# Patient Record
Sex: Female | Born: 1995 | Race: White | Hispanic: No | Marital: Single | Smoking: Current every day smoker
Health system: Southern US, Community
[De-identification: ages and names within clinical notes are randomized; demographics above are authoritative.]

## PROBLEM LIST (undated history)

## (undated) DIAGNOSIS — B1920 Unspecified viral hepatitis C without hepatic coma: Secondary | ICD-10-CM

## (undated) DIAGNOSIS — F1920 Other psychoactive substance dependence, uncomplicated: Secondary | ICD-10-CM

## (undated) DIAGNOSIS — J45909 Unspecified asthma, uncomplicated: Secondary | ICD-10-CM

## (undated) DIAGNOSIS — F1910 Other psychoactive substance abuse, uncomplicated: Secondary | ICD-10-CM

---
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
## 2016-02-02 ENCOUNTER — Encounter: Payer: Self-pay | Admitting: Emergency Medicine

## 2016-02-02 ENCOUNTER — Emergency Department
Admission: EM | Admit: 2016-02-02 | Discharge: 2016-02-02 | Disposition: A | Discharge status: Home / Self Care | Payer: Self-pay | Attending: Emergency Medicine | Admitting: Emergency Medicine

## 2016-02-02 DIAGNOSIS — M79641 Pain in right hand: Secondary | ICD-10-CM | POA: Insufficient documentation

## 2016-02-02 DIAGNOSIS — Z5321 Procedure and treatment not carried out due to patient leaving prior to being seen by health care provider: Secondary | ICD-10-CM | POA: Insufficient documentation

## 2016-02-02 HISTORY — DX: Unspecified viral hepatitis C without hepatic coma: B19.20

## 2016-02-02 NOTE — ED Notes (Signed)
Patient has not returned - eloped from the status board at this time.

## 2016-02-02 NOTE — ED Notes (Signed)
Patient presented to the desk at this time to report that she was leaving to pick up here husband, but would "be right back".

## 2016-02-02 NOTE — ED Notes (Signed)
Pt with R ring finger swelling and pain starting 7 days ago.  Finger appears swollen, purple.  Pt states finger feels like "pins and needles" and throbbing.  Pt denies fever, but reports being able to squeeze puss out of it a few days ago.  Pt unable to move finger.  Denies knowledge of any injury.

## 2017-06-24 ENCOUNTER — Emergency Department (HOSPITAL_COMMUNITY)
Admission: EM | Admit: 2017-06-24 | Discharge: 2017-06-24 | Disposition: A | Discharge status: Home / Self Care | Payer: Self-pay | Attending: Emergency Medicine | Admitting: Emergency Medicine

## 2017-06-24 ENCOUNTER — Encounter (HOSPITAL_COMMUNITY): Payer: Self-pay | Admitting: Emergency Medicine

## 2017-06-24 DIAGNOSIS — F1123 Opioid dependence with withdrawal: Secondary | ICD-10-CM

## 2017-06-24 DIAGNOSIS — F17200 Nicotine dependence, unspecified, uncomplicated: Secondary | ICD-10-CM | POA: Insufficient documentation

## 2017-06-24 HISTORY — DX: Other psychoactive substance dependence, uncomplicated: F19.20

## 2017-06-24 LAB — RAPID URINE DRUG SCREEN, HOSP PERFORMED
Amphetamines: POSITIVE — AB
BARBITURATES: NOT DETECTED
BENZODIAZEPINES: NOT DETECTED
Cocaine: NOT DETECTED
Opiates: POSITIVE — AB
Tetrahydrocannabinol: NOT DETECTED

## 2017-06-24 LAB — COMPREHENSIVE METABOLIC PANEL WITH GFR
ALT: 46 U/L (ref 14–54)
AST: 32 U/L (ref 15–41)
Albumin: 3.8 g/dL (ref 3.5–5.0)
Alkaline Phosphatase: 57 U/L (ref 38–126)
Anion gap: 7 (ref 5–15)
BUN: 19 mg/dL (ref 6–20)
CO2: 28 mmol/L (ref 22–32)
Calcium: 8.9 mg/dL (ref 8.9–10.3)
Chloride: 104 mmol/L (ref 101–111)
Creatinine, Ser: 0.69 mg/dL (ref 0.44–1.00)
GFR calc Af Amer: >60 mL/min
GFR calc non Af Amer: >60 mL/min
Glucose, Bld: 111 mg/dL — ABNORMAL HIGH (ref 65–99)
Potassium: 4.1 mmol/L (ref 3.5–5.1)
Sodium: 139 mmol/L (ref 135–145)
Total Bilirubin: 0.8 mg/dL (ref 0.3–1.2)
Total Protein: 7.6 g/dL (ref 6.5–8.1)

## 2017-06-24 LAB — CBC
HCT: 40.3 % (ref 36.0–46.0)
Hemoglobin: 13.5 g/dL (ref 12.0–15.0)
MCH: 29.8 pg (ref 26.0–34.0)
MCHC: 33.5 g/dL (ref 30.0–36.0)
MCV: 89 fL (ref 78.0–100.0)
Platelets: 252 10*3/uL (ref 150–400)
RBC: 4.53 MIL/uL (ref 3.87–5.11)
RDW: 13.6 % (ref 11.5–15.5)
WBC: 7.4 10*3/uL (ref 4.0–10.5)

## 2017-06-24 LAB — ETHANOL: Alcohol, Ethyl (B): <10 mg/dL (ref <10)

## 2017-06-24 MED ORDER — ONDANSETRON HCL 4 MG PO TABS: 4.0000 mg | ORAL_TABLET | Freq: Four times a day (QID) | ORAL | 0 refills | Status: DC | PRN

## 2017-06-24 MED ORDER — LOPERAMIDE HCL 2 MG PO CAPS: 2.0000 mg | ORAL_CAPSULE | Freq: Four times a day (QID) | ORAL | 0 refills | Status: DC | PRN

## 2017-06-24 MED ORDER — METHOCARBAMOL 750 MG PO TABS: 750.0000 mg | ORAL_TABLET | Freq: Four times a day (QID) | ORAL | 0 refills | Status: DC | PRN

## 2017-06-24 NOTE — Discharge Instructions (Signed)
You are medically cleared for rehab/detox. Attached are a list of facilities  Please return for worsening symptoms, including intractable vomiting, confusion, thoughts of wanting to hurt yourself or other, or any other symptoms concerning to you.

## 2017-06-24 NOTE — ED Notes (Signed)
Pt was brought back to room 19, all wires was removed form room.

## 2017-06-24 NOTE — ED Triage Notes (Addendum)
Wants detox from heroin , last used last night .  Denies SI or HI  At this time

## 2017-06-24 NOTE — ED Provider Notes (Signed)
[facility] EMERGENCY DEPARTMENT Provider Note   CSN: [id number] Arrival date & time: 06/24/17  1216     History   Chief Complaint Chief Complaint  Patient presents with  . Medical Clearance    HPI Tracy Fowler is a 21 y.o. female.  HPI 21 year old female who presents for medical clearance for opiate dependency rehab. She has a history of opiate/heroin dependence. States that she has been abusing for over a year, and has been trying to quit by herself. Her mother has been assisting her, and is found her rehabilitation facility for her to go to but they requested that she come to the ED for medical clearance. Her last use of heroin was yesterday evening. Currently denies any nausea, vomiting, diarrhea, or any severe withdrawal symptoms. Denies any suicidal or homicidal thoughts. Denies etoh or other drug abuse.    Past Medical History:  Diagnosis Date  . Drug addict (HCC)   . Hepatitis C     There are no active problems to display for this patient.   History reviewed. No pertinent surgical history.  OB History    No data available       Home Medications    Prior to Admission medications   Medication Sig Start Date End Date Taking? Authorizing Provider  loperamide (IMODIUM) 2 MG capsule Take 1 capsule (2 mg total) by mouth 4 (four) times daily as needed for diarrhea or loose stools. 06/24/17   Lavera Guise, MD  methocarbamol (ROBAXIN-750) 750 MG tablet Take 1 tablet (750 mg total) by mouth every 6 (six) hours as needed for muscle spasms. 06/24/17   Lavera Guise, MD  ondansetron (ZOFRAN) 4 MG tablet Take 1 tablet (4 mg total) by mouth every 6 (six) hours as needed for nausea or vomiting. 06/24/17   Lavera Guise, MD    Family History No family history on file.  Social History Social History  Substance Use Topics  . Smoking status: Current Every Day Smoker  . Smokeless tobacco: Never Used  . Alcohol use No     Allergies    Prednisone   Review of Systems Review of Systems  Constitutional: Negative for fever.  Respiratory: Negative for shortness of breath.   Cardiovascular: Negative for chest pain.  Gastrointestinal: Negative for abdominal pain, nausea and vomiting.  Genitourinary: Negative for difficulty urinating.  All other systems reviewed and are negative.    Physical Exam Updated Vital Signs BP 105/72 (BP Location: Right Arm)   Pulse (!) 104   Temp 97.9 F (36.6 C)   Resp 16   SpO2 98%   Physical Exam Physical Exam  Nursing note and vitals reviewed. Constitutional: Well developed, well nourished, non-toxic, and in no acute distress Head: Normocephalic and atraumatic.  Mouth/Throat: Oropharynx is clear and moist.  Neck: Normal range of motion. Neck supple.  Cardiovascular: Normal rate and regular rhythm.   Pulmonary/Chest: Effort normal and breath sounds normal.  Abdominal: Soft. There is no tenderness. There is no rebound and no guarding.  Musculoskeletal: Normal range of motion.  Neurological: Alert, no facial droop, fluent speech, moves all extremities symmetrically Skin: Skin is warm and dry.  Psychiatric: Cooperative   ED Treatments / Results  Labs (all labs ordered are listed, but only abnormal results are displayed) Labs Reviewed  COMPREHENSIVE METABOLIC PANEL - Abnormal; Notable for the following:       Result Value   Glucose, Bld 111 (*)    All other components  within normal limits  RAPID URINE DRUG SCREEN, HOSP PERFORMED - Abnormal; Notable for the following:    Opiates POSITIVE (*)    Amphetamines POSITIVE (*)    All other components within normal limits  ETHANOL  CBC    EKG  EKG Interpretation None       Radiology No results found.  Procedures Procedures (including critical care time)  Medications Ordered in ED Medications - No data to display   Initial Impression / Assessment and Plan / ED Course  I have reviewed the triage vital signs and the  nursing notes.  Pertinent labs & imaging results that were available during my care of the patient were reviewed by me and considered in my medical decision making (see chart for details).     Patient well appearing, no acute distress. No evidence of psychosis or delusions. No SI/HI. Does not appear to have acute psychiatric complaint. Requesting assistance for detox. At this time, her withdrawal symptoms are reported to be very mild. Will prescribe symptomatic treatment for when symptoms become more severe. Resources provided for detox/rehab facilities, but she states there was one that she and her mother called already that they had in mind. Strict return and follow-up instructions reviewed. She expressed understanding of all discharge instructions and felt comfortable with the plan of care.    Final Clinical Impressions(s) / ED Diagnoses   Final diagnoses:  Opioid dependence with withdrawal (HCC)    New Prescriptions New Prescriptions   LOPERAMIDE (IMODIUM) 2 MG CAPSULE    Take 1 capsule (2 mg total) by mouth 4 (four) times daily as needed for diarrhea or loose stools.   METHOCARBAMOL (ROBAXIN-750) 750 MG TABLET    Take 1 tablet (750 mg total) by mouth every 6 (six) hours as needed for muscle spasms.   ONDANSETRON (ZOFRAN) 4 MG TABLET    Take 1 tablet (4 mg total) by mouth every 6 (six) hours as needed for nausea or vomiting.     Lavera GuiseLiu, Deloyd Handy Duo, MD 06/24/17 [id number]

## 2017-06-24 NOTE — ED Notes (Addendum)
Pt placed in hall holding area at pt request to maintain pt dignity. Mother to bring clothes for pt to change before leaving ED. Nurse first notified of relocation.

## 2018-09-03 DIAGNOSIS — I38 Endocarditis, valve unspecified: Secondary | ICD-10-CM

## 2018-09-03 DIAGNOSIS — R7881 Bacteremia: Secondary | ICD-10-CM

## 2018-09-03 DIAGNOSIS — I76 Septic arterial embolism: Secondary | ICD-10-CM

## 2018-09-03 HISTORY — DX: Endocarditis, valve unspecified: I38

## 2018-09-03 HISTORY — DX: Septic arterial embolism: I76

## 2018-09-03 HISTORY — DX: Bacteremia: R78.81

## 2019-02-17 ENCOUNTER — Encounter (HOSPITAL_COMMUNITY): Payer: Self-pay | Admitting: Infectious Diseases

## 2019-02-17 ENCOUNTER — Inpatient Hospital Stay (HOSPITAL_COMMUNITY)
Admission: AD | Admit: 2019-02-17 | Discharge: 2019-02-24 | DRG: 871 | Discharge status: Against Medical Advice | Payer: Self-pay | Source: Other Acute Inpatient Hospital | Attending: Internal Medicine | Admitting: Internal Medicine

## 2019-02-17 DIAGNOSIS — I76 Septic arterial embolism: Secondary | ICD-10-CM | POA: Diagnosis present

## 2019-02-17 DIAGNOSIS — R740 Nonspecific elevation of levels of transaminase and lactic acid dehydrogenase [LDH]: Secondary | ICD-10-CM

## 2019-02-17 DIAGNOSIS — F1123 Opioid dependence with withdrawal: Secondary | ICD-10-CM | POA: Diagnosis present

## 2019-02-17 DIAGNOSIS — J45909 Unspecified asthma, uncomplicated: Secondary | ICD-10-CM | POA: Diagnosis present

## 2019-02-17 DIAGNOSIS — I330 Acute and subacute infective endocarditis: Secondary | ICD-10-CM | POA: Diagnosis present

## 2019-02-17 DIAGNOSIS — A4102 Sepsis due to Methicillin resistant Staphylococcus aureus: Principal | ICD-10-CM | POA: Diagnosis present

## 2019-02-17 DIAGNOSIS — R7881 Bacteremia: Secondary | ICD-10-CM

## 2019-02-17 DIAGNOSIS — B9562 Methicillin resistant Staphylococcus aureus infection as the cause of diseases classified elsewhere: Secondary | ICD-10-CM | POA: Diagnosis present

## 2019-02-17 DIAGNOSIS — B1920 Unspecified viral hepatitis C without hepatic coma: Secondary | ICD-10-CM | POA: Diagnosis present

## 2019-02-17 DIAGNOSIS — F1990 Other psychoactive substance use, unspecified, uncomplicated: Secondary | ICD-10-CM

## 2019-02-17 DIAGNOSIS — Z5329 Procedure and treatment not carried out because of patient's decision for other reasons: Secondary | ICD-10-CM | POA: Diagnosis not present

## 2019-02-17 DIAGNOSIS — Z1159 Encounter for screening for other viral diseases: Secondary | ICD-10-CM

## 2019-02-17 DIAGNOSIS — I2690 Septic pulmonary embolism without acute cor pulmonale: Secondary | ICD-10-CM | POA: Diagnosis present

## 2019-02-17 DIAGNOSIS — R7401 Elevation of levels of liver transaminase levels: Secondary | ICD-10-CM

## 2019-02-17 DIAGNOSIS — Z888 Allergy status to other drugs, medicaments and biological substances status: Secondary | ICD-10-CM

## 2019-02-17 DIAGNOSIS — F17200 Nicotine dependence, unspecified, uncomplicated: Secondary | ICD-10-CM | POA: Diagnosis present

## 2019-02-17 DIAGNOSIS — B957 Other staphylococcus as the cause of diseases classified elsewhere: Secondary | ICD-10-CM | POA: Diagnosis present

## 2019-02-17 LAB — COMPREHENSIVE METABOLIC PANEL
ALT: 108 U/L — ABNORMAL HIGH (ref 0–44)
AST: 63 U/L — ABNORMAL HIGH (ref 15–41)
Albumin: 3.2 g/dL — ABNORMAL LOW (ref 3.5–5.0)
Alkaline Phosphatase: 77 U/L (ref 38–126)
Anion gap: 13 (ref 5–15)
BUN: 10 mg/dL (ref 6–20)
CO2: 23 mmol/L (ref 22–32)
Calcium: 8.6 mg/dL — ABNORMAL LOW (ref 8.9–10.3)
Chloride: 97 mmol/L — ABNORMAL LOW (ref 98–111)
Creatinine, Ser: 0.65 mg/dL (ref 0.44–1.00)
GFR calc Af Amer: >60 mL/min (ref >60)
GFR calc non Af Amer: >60 mL/min (ref >60)
Glucose, Bld: 87 mg/dL (ref 70–99)
Potassium: 3.6 mmol/L (ref 3.5–5.1)
Sodium: 133 mmol/L — ABNORMAL LOW (ref 135–145)
Total Bilirubin: 1.9 mg/dL — ABNORMAL HIGH (ref 0.3–1.2)
Total Protein: 8.1 g/dL (ref 6.5–8.1)

## 2019-02-17 LAB — CBC WITH DIFFERENTIAL/PLATELET
Abs Immature Granulocytes: 0.06 10*3/uL (ref 0.00–0.07)
Basophils Absolute: 0 10*3/uL (ref 0.0–0.1)
Basophils Relative: 0 %
Eosinophils Absolute: 0 10*3/uL (ref 0.0–0.5)
Eosinophils Relative: 0 %
HCT: 38.5 % (ref 36.0–46.0)
Hemoglobin: 12.4 g/dL (ref 12.0–15.0)
Immature Granulocytes: 0 %
Lymphocytes Relative: 15 %
Lymphs Abs: 2.2 10*3/uL (ref 0.7–4.0)
MCH: 29.2 pg (ref 26.0–34.0)
MCHC: 32.2 g/dL (ref 30.0–36.0)
MCV: 90.6 fL (ref 80.0–100.0)
Monocytes Absolute: 1 10*3/uL (ref 0.1–1.0)
Monocytes Relative: 7 %
Neutro Abs: 11.4 10*3/uL — ABNORMAL HIGH (ref 1.7–7.7)
Neutrophils Relative %: 78 %
Platelets: 205 10*3/uL (ref 150–400)
RBC: 4.25 MIL/uL (ref 3.87–5.11)
RDW: 13.2 % (ref 11.5–15.5)
WBC: 14.7 10*3/uL — ABNORMAL HIGH (ref 4.0–10.5)
nRBC: 0 % (ref 0.0–0.2)

## 2019-02-17 LAB — MAGNESIUM: Magnesium: 2.1 mg/dL (ref 1.7–2.4)

## 2019-02-17 MED ORDER — PIPERACILLIN-TAZOBACTAM 3.375 G IVPB
3.3750 g | Freq: Three times a day (TID) | INTRAVENOUS | Status: DC
  Administered 2019-02-17 – 2019-02-18 (×2): 3.375 g via INTRAVENOUS
  Filled 2019-02-17 (×3): qty 50

## 2019-02-17 MED ORDER — ACETAMINOPHEN 650 MG RE SUPP: 650.0000 mg | Freq: Four times a day (QID) | RECTAL | Status: DC | PRN

## 2019-02-17 MED ORDER — SODIUM CHLORIDE 0.9 % IV SOLN
INTRAVENOUS | Status: DC
  Administered 2019-02-17 – 2019-02-23 (×8): via INTRAVENOUS

## 2019-02-17 MED ORDER — ACETAMINOPHEN 325 MG PO TABS
650.0000 mg | ORAL_TABLET | Freq: Four times a day (QID) | ORAL | Status: DC | PRN
  Administered 2019-02-17 – 2019-02-24 (×4): 650 mg via ORAL
  Filled 2019-02-17 (×4): qty 2

## 2019-02-17 MED ORDER — MORPHINE SULFATE (PF) 2 MG/ML IV SOLN
2.0000 mg | INTRAVENOUS | Status: DC | PRN
  Administered 2019-02-17 – 2019-02-24 (×31): 2 mg via INTRAVENOUS
  Filled 2019-02-17 (×33): qty 1

## 2019-02-17 MED ORDER — VANCOMYCIN HCL IN DEXTROSE 1-5 GM/200ML-% IV SOLN
1000.0000 mg | Freq: Two times a day (BID) | INTRAVENOUS | Status: DC
  Administered 2019-02-18 – 2019-02-24 (×13): 1000 mg via INTRAVENOUS
  Filled 2019-02-17 (×17): qty 200

## 2019-02-17 NOTE — Progress Notes (Signed)
Pharmacy Antibiotic Note  Tracy Fowler is a 23 y.o. female admitted on 02/17/2019 with r/o endocarditis.  Pharmacy has been consulted for Vanco/Zosyn dosing.  CC/HPI:  Transfer from [place] with rib/back/leg/chest pain with bilateral pulmonary septic emboli, transaminitis  PMH: IVDA, h/o Hep C,  tobacco  Significant events: Randloph labs, WBC 12, Scr 0.5, Tbili 1.7, AST/ALT 78/146, UDS + amphetamines and opiates.   Plan: TEE Vanco 1500mg  IV load at [facility] then start 1g IV q 12hrs Zosyn 3.375g IV q 8hrs.    Height: 5\' 5"  (165.1 cm) Weight: 136 lb 0.4 oz (61.7 kg) IBW/kg (Calculated) : 57  Temp (24hrs), Avg:99.5 F (37.5 C), Min:97.8 F (36.6 C), Max:101.1 F (38.4 C)  Recent Labs  Lab 02/17/19 1535  WBC 14.7*  CREATININE 0.65    Estimated Creatinine Clearance: 96.7 mL/min (by C-G formula based on SCr of 0.65 mg/dL).    Allergies  Allergen Reactions  . Prednisone Swelling     Leaann Nevils S. Alford Highland, PharmD, [place] Clinical Staff Pharmacist [place], [place] 02/17/2019 7:02 PM

## 2019-02-17 NOTE — Progress Notes (Signed)
Rec'd pt. Order for EKG noted. Pt refused EKG.

## 2019-02-17 NOTE — Consult Note (Signed)
Tracy Fowler for Infectious Disease    Date of Admission:  02/17/2019      Total days of antibiotics 1  Given vancomycin x 1 at [facility]             Reason for Consult: Septic Pulmonary Emboli     Referring Provider: Jamse Arn Primary Care Provider: Patient, No Pcp Per    Assessment: Tracy Fowler is a 23 y.o. female admitted with severe chest pain and found to have what appears to be bilateral pulmonary septic emboli. She has a history of active injection drug use making this suspicious and concerning for right sided endocarditis. Will need TEE to evaluate. Please hold on PICC placement if possible pending evaluation of blood cultures. Will need ongoing evaluation for signs of metastatic infection.   So far no positive blood cultures but will call [facility] again in AM for update. Will plan to repeat blood cultures here presuming she is bacteremic. Most likely staph aureus infection but will add ceftriaxone until initial blood cultures return. Continue Vancomycin per pharmacy protocol.   Will check HIV Ab, Hep C RNA (reports positive hep C Ab), Hep B sAg with elevated LFTs.   I offered to call and update family for her - she was unable to give me any contact information regarding how to get in touch with her mom.     Plan: 1. Repeat blood cultures here 2. After these are drawn will start vancomycin + ceftriaxone 2 gm IV q24h 3. TEE to evaluate for high degree of suspicion for bacterial endocarditis 4. Hold on PICC  5. Hep C/B and HIV assessment   Active Problems:   Septic embolism (HCC)   Injection of illicit drug within last 12 months   Transaminitis    HPI: Tracy Fowler is a 23 y.o. female transferred to [facility] for management of suspected endocarditis from [facility]. Personal history of hepatitis C, severe opioid dependence with active IV drug use.   She presented to [place]'s ER on 02/17/19 with 2 day history of rib pain, back pain  and leg pain with dyspnea. ER evaluation revealed leukocytosis 12.oK, Creatinine 0.5, TBili 1.7 AST 78 ALT 146 AlkPhos 91, CRP 36.6, COVID-19 negative, UPT negative, UDS + amphetamines and opiates. CXR with no active pulmonary disease but CT scan of the chest with multiple bilateral pleural-based, rounded pulmonary opacities including one with central clearing in the anterolateral left lower lobe. Blood cultures were drawn earlier this morning ~8am. She was given vancomycin in the ER x 1 dose.   She is in a significant amount of pain at the time of my assessment. She complains of significant chest pain R>L with taking in a breath and has a cough that "kills her." She is crying. She also complains of left calf pain. She tells me she was having fevers/chills at home prior to seeking care at [facility] ER and states her symptoms started abruptly 2 days ago. 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  Review of Systems: Review of Systems  Constitutional: Positive for chills, diaphoresis and fever.  HENT: Negative for sore throat.   Eyes: Negative for blurred vision and pain.  Respiratory: Positive for cough and shortness of breath.   Cardiovascular: Positive for chest pain. Negative for leg swelling.  Gastrointestinal:  Negative for abdominal pain, constipation and diarrhea.  Genitourinary: Negative for dysuria.  Musculoskeletal:       Left calf pain   Skin: Negative for rash.  Neurological: Negative for dizziness and headaches.  Psychiatric/Behavioral: Positive for substance abuse.    Past Medical History:  Diagnosis Date  . Drug addict (HCC)   . Hepatitis C     Social History   Tobacco Use  . Smoking status: Current Every Day Smoker  . Smokeless tobacco: Never Used  Substance Use Topics  . Alcohol use: No  . Drug use: No    Comment:  prescription pain killers n past;  pt states she does not use them any more    No family history on file. Allergies  Allergen Reactions  . Prednisone Swelling    OBJECTIVE: Blood pressure 116/65, pulse (!) 119, temperature 97.8 F (36.6 C), resp. rate 18, SpO2 98 %.  Physical Exam Constitutional:      Comments: Writhing in bed in pain. Rolling from side to side. Crying.   HENT:     Mouth/Throat:     Mouth: Mucous membranes are moist.     Pharynx: Oropharynx is clear.  Eyes:     General: No scleral icterus. Cardiovascular:     Rate and Rhythm: Regular rhythm. Tachycardia present.     Comments: Limited assessment d/t current pain levl Pulmonary:     Effort: Pulmonary effort is normal.     Breath sounds: Rhonchi present.     Comments: Shallow inspiratory effort Abdominal:     General: There is no distension.     Palpations: Abdomen is soft.     Tenderness: There is no abdominal tenderness.  Musculoskeletal:     Comments: L posterior calf with tenderness overlying muscle. Small open area that is non-draining with red wound base. No swelling or deformity noted.   Skin:    General: Skin is warm and dry.     Capillary Refill: Capillary refill takes less than 2 seconds.     Comments: No splinter hemorrhages, Janeway lesions or Osler nodes from what I can tell but she is currently difficult to examine.   Neurological:     Mental Status: She is alert and oriented to person, place, and time.     Lab Results Lab Results  Component Value Date   WBC 14.7 (H) 02/17/2019   HGB 12.4 02/17/2019   HCT 38.5 02/17/2019   MCV 90.6 02/17/2019   PLT 205 02/17/2019    Lab Results  Component Value Date   CREATININE 0.69 06/24/2017   BUN 19 06/24/2017   NA 139 06/24/2017   K 4.1 06/24/2017   CL 104 06/24/2017   CO2 28 06/24/2017    Lab Results  Component Value Date   ALT 46 06/24/2017   AST 32 06/24/2017   ALKPHOS 57 06/24/2017   BILITOT 0.8 06/24/2017     Microbiology: No  results found for this or any previous visit (from the past 240 hour(s)).   Rexene AlbertsStephanie Alyx Gee, MSN, NP-C [facility]  [email or web address] Pager: [phone] Office: [phone] [facility] Main Line: [phone]   02/17/2019 5:27 PM

## 2019-02-17 NOTE — Plan of Care (Signed)
  Problem: Clinical Measurements: Goal: Diagnostic test results will improve Outcome: Progressing Goal: Signs and symptoms of infection will decrease Outcome: Progressing   Problem: Education: Goal: Knowledge of General Education information will improve Description: Including pain rating scale, medication(s)/side effects and non-pharmacologic comfort measures Outcome: Progressing   Problem: Clinical Measurements: Goal: Respiratory complications will improve Outcome: Progressing   Problem: Nutrition: Goal: Adequate nutrition will be maintained Outcome: Progressing   Problem: Elimination: Goal: Will not experience complications related to bowel motility Outcome: Progressing   Problem: Pain Managment: Goal: General experience of comfort will improve Outcome: Progressing

## 2019-02-17 NOTE — H&P (Signed)
History and Physical:    Tracy Fowler   OZH:[id number] DOB: August 28, 1996 DOA: 02/17/2019  Referring MD/provider: Duke Salviaandolph PCP: Patient, No Pcp Per   Patient coming from: Home  Chief Complaint: Pleuritic chest pain  History of Present Illness:   Tracy Clichelyssa L Palomino is an 23 y.o. female with past medical history significant for asthma and ongoing IVDU who last shot up last week was in her usual state of relatively poor health until 3 days ago when she noted onset of rib pain.  Patient initially thought that she had "pulled a rib muscle" however pain increased significantly and it became difficult to breathe secondary to pain.  Patient did have some associated shortness of breath but is unable to quantify exactly how much.  She also noted that she did have a cough however it was nonproductive.  Patient noted the cough was worsened when she was crying from the pain.  Patient admits to feeling hot and having some chills and sweats.  No chest pain.  No new lower extremity swelling.  No nausea vomiting diarrhea.  No hemoptysis.  ED Course:  The patient was noted to be febrile with a mild leukocytosis.  CT angiogram showed multiple bilateral rounded pulmonary opacities consistent with possibly septic emboli.  ROS:   ROS   Review of Systems: General: No fever, chills, weight changes Skin: No rashes, lesions, wounds Eyes: no discharge, redness, pain HENT: no ear pain, hearing loss, drainage, tinnitus Endocrine: no heat/cold intolerance, no polyuria Respiratory: Positive cough,, shortness of breath, hemoptysis Cardiovascular: No palpitations, chest pain GI: No nausea, vomiting, diarrhea, constipation GU: No dysuria, increased frequency CNS: No numbness, dizziness, headache Musculoskeletal: No back pain, joint pain Blood/lymphatics: No easy bruising, bleeding Mood/affect: No anxiety/depression    Past Medical History:   Past Medical History:  Diagnosis Date  . Drug addict (HCC)   .  Hepatitis C     Past Surgical History:   No past surgical history on file.  Social History:   Social History   Socioeconomic History  . Marital status: Single    Spouse name: Not on file  . Number of children: Not on file  . Years of education: Not on file  . Highest education level: Not on file  Occupational History  . Not on file  Social Needs  . Financial resource strain: Not on file  . Food insecurity    Worry: Not on file    Inability: Not on file  . Transportation needs    Medical: Not on file    Non-medical: Not on file  Tobacco Use  . Smoking status: Current Every Day Smoker  . Smokeless tobacco: Never Used  Substance and Sexual Activity  . Alcohol use: No  . Drug use: Yes    Comment: prescription pain killers n past;  pt states she does not use them any more  . Sexual activity: Not on file  Lifestyle  . Physical activity    Days per week: Not on file    Minutes per session: Not on file  . Stress: Not on file  Relationships  . Social Musicianconnections    Talks on phone: Not on file    Gets together: Not on file    Attends religious service: Not on file    Active member of club or organization: Not on file    Attends meetings of clubs or organizations: Not on file    Relationship status: Not on file  . Intimate partner violence  Fear of current or ex partner: Not on file    Emotionally abused: Not on file    Physically abused: Not on file    Forced sexual activity: Not on file  Other Topics Concern  . Not on file  Social History Narrative  . Not on file    Allergies   Prednisone  Family history:   No family history on file.  Current Medications:   Prior to Admission medications   Not on File    Physical Exam:   Vitals:   02/17/19 1501 02/17/19 1553 02/17/19 1837  BP: 120/77 116/65   Pulse:  (!) 119   Resp: 20 18   Temp: (!) 101.1 F (38.4 C) 97.8 F (36.6 C)   TempSrc: Oral    SpO2: 100% 98%   Weight:   61.7 kg  Height:   5\' 5"   (1.651 m)     Physical Exam: Blood pressure 116/65, pulse (!) 119, temperature 97.8 F (36.6 C), resp. rate 18, height 5\' 5"  (1.651 m), weight 61.7 kg, SpO2 98 %. Gen: Tearful female looking older than stated age with multiple sores on her face lying in bed holding a pillow to her chest. Chest: Decreased air entry secondary to decreased inspiratory effort.  No wheezes noted. CV: Tachycardic, regular, 2/6 systolic murmur throughout precordium. Abdomen: NABS, soft, nondistended, nontender.  Extremities: No edema.  Psych: Patient is regressed, tearful, possibly withdrawing, not very cooperative however she did listen intently when I spoke to her of endocarditis and seemed to understand.  Data Review:    Labs: Basic Metabolic Panel: Recent Labs  Lab 02/17/19 1535  NA 133*  K 3.6  CL 97*  CO2 23  GLUCOSE 87  BUN 10  CREATININE 0.65  CALCIUM 8.6*  MG 2.1   Liver Function Tests: Recent Labs  Lab 02/17/19 1535  AST 63*  ALT 108*  ALKPHOS 77  BILITOT 1.9*  PROT 8.1  ALBUMIN 3.2*   No results for input(s): LIPASE, AMYLASE in the last 168 hours. No results for input(s): AMMONIA in the last 168 hours. CBC: Recent Labs  Lab 02/17/19 1535  WBC 14.7*  NEUTROABS 11.4*  HGB 12.4  HCT 38.5  MCV 90.6  PLT 205   Cardiac Enzymes: No results for input(s): CKTOTAL, CKMB, CKMBINDEX, TROPONINI in the last 168 hours.  BNP (last 3 results) No results for input(s): PROBNP in the last 8760 hours. CBG: No results for input(s): GLUCAP in the last 168 hours.  Urinalysis No results found for: COLORURINE, APPEARANCEUR, LABSPEC, PHURINE, GLUCOSEU, HGBUR, BILIRUBINUR, KETONESUR, PROTEINUR, UROBILINOGEN, NITRITE, LEUKOCYTESUR    Radiographic Studies: No results found.  CT angiogram from [facility]: There are multiple bilateral pleural-based, rounded, pulmonary opacities, including one with central clearing in the anterolateral left lower lobe (series 2, image 130).  These are  highly suspicious for developing pulmonary infarcts, however in the setting of IV drug use, septic emboli are a differential consideration.  Consider repeat CT angiogram for further evaluation.  Scintigraphic pulmonary VQ scan and ancillary extremity DVT ultrasound may be helpful as alternate means of evaluation.  Probable splenomegaly, although incompletely imaged in the upper abdomen.  EKG: Ordered and pending  Assessment/Plan:   Active Problems:   Septic embolism (HCC)   Injection of illicit drug within last 12 months   Transaminitis  23 year old female with ongoing drug use presents with fevers chills and what appear to be septic emboli on CT angiogram.  ENDOCARDITIS Patient with likely right-sided endocarditis. I have  ordered blood cultures for tonight and for repeat in the morning.   Echocardiogram requested for the morning. ID consult requested. Will start vancomycin and Zosyn. Splenomegaly and elevated LFTs consistent with clinical picture of endocarditis with emboli.  ASTHMA No evidence of acute exacerbation of asthma on examination. PRN albuterol as needed.     Other information:   DVT prophylaxis: SCDs  code Status: Full code. Family Communication: Patient did not want me to call her ex-boyfriend with whom she lives Disposition Plan: Home Consults called: Infectious disease Admission status: Inpatient  The medical decision making is of moderate complexity, therefore this is a level 2 visit.  Horatio PelSrobona Tublu Maelani Yarbro [facility]  If 7PM-7AM, please contact night-coverage [email or web address] Password [email or web address] 02/17/2019, 8:27 PM

## 2019-02-17 NOTE — Progress Notes (Signed)
Patient admits to room 2W room 8, alert oriented c/o 8/10 pain in her left ribs. VS within normal range except temp 101.1. MD notified, awaiting on orders. Patient is in bed locked at lower level, call light, telephone and bedside table within patient reach. Will continue to monitor patient.

## 2019-02-18 ENCOUNTER — Encounter (HOSPITAL_COMMUNITY): Payer: Self-pay

## 2019-02-18 ENCOUNTER — Other Ambulatory Visit: Payer: Self-pay

## 2019-02-18 DIAGNOSIS — R7881 Bacteremia: Secondary | ICD-10-CM

## 2019-02-18 DIAGNOSIS — B9562 Methicillin resistant Staphylococcus aureus infection as the cause of diseases classified elsewhere: Secondary | ICD-10-CM

## 2019-02-18 DIAGNOSIS — F1123 Opioid dependence with withdrawal: Secondary | ICD-10-CM

## 2019-02-18 LAB — CBC
HCT: 36.7 % (ref 36.0–46.0)
Hemoglobin: 12.2 g/dL (ref 12.0–15.0)
MCH: 29.2 pg (ref 26.0–34.0)
MCHC: 33.2 g/dL (ref 30.0–36.0)
MCV: 87.8 fL (ref 80.0–100.0)
Platelets: 195 10*3/uL (ref 150–400)
RBC: 4.18 MIL/uL (ref 3.87–5.11)
RDW: 13.2 % (ref 11.5–15.5)
WBC: 12 10*3/uL — ABNORMAL HIGH (ref 4.0–10.5)
nRBC: 0 % (ref 0.0–0.2)

## 2019-02-18 LAB — COMPREHENSIVE METABOLIC PANEL
ALT: 91 U/L — ABNORMAL HIGH (ref 0–44)
AST: 51 U/L — ABNORMAL HIGH (ref 15–41)
Albumin: 3.1 g/dL — ABNORMAL LOW (ref 3.5–5.0)
Alkaline Phosphatase: 76 U/L (ref 38–126)
Anion gap: 9 (ref 5–15)
BUN: 7 mg/dL (ref 6–20)
CO2: 23 mmol/L (ref 22–32)
Calcium: 8.6 mg/dL — ABNORMAL LOW (ref 8.9–10.3)
Chloride: 102 mmol/L (ref 98–111)
Creatinine, Ser: 0.56 mg/dL (ref 0.44–1.00)
GFR calc Af Amer: >60 mL/min (ref >60)
GFR calc non Af Amer: >60 mL/min (ref >60)
Glucose, Bld: 107 mg/dL — ABNORMAL HIGH (ref 70–99)
Potassium: 3.6 mmol/L (ref 3.5–5.1)
Sodium: 134 mmol/L — ABNORMAL LOW (ref 135–145)
Total Bilirubin: 1.7 mg/dL — ABNORMAL HIGH (ref 0.3–1.2)
Total Protein: 7.8 g/dL (ref 6.5–8.1)

## 2019-02-18 LAB — HIV ANTIBODY (ROUTINE TESTING W REFLEX): HIV Screen 4th Generation wRfx: NONREACTIVE

## 2019-02-18 MED ORDER — METHOCARBAMOL 500 MG PO TABS
500.0000 mg | ORAL_TABLET | Freq: Three times a day (TID) | ORAL | Status: AC | PRN
  Administered 2019-02-19 – 2019-02-22 (×5): 500 mg via ORAL
  Filled 2019-02-18 (×6): qty 1

## 2019-02-18 MED ORDER — NAPROXEN 250 MG PO TABS
500.0000 mg | ORAL_TABLET | Freq: Two times a day (BID) | ORAL | Status: AC | PRN
  Administered 2019-02-21 – 2019-02-22 (×2): 500 mg via ORAL
  Filled 2019-02-18 (×2): qty 2

## 2019-02-18 MED ORDER — CLONIDINE HCL 0.1 MG PO TABS
0.1000 mg | ORAL_TABLET | ORAL | Status: AC
  Administered 2019-02-18 – 2019-02-22 (×5): 0.1 mg via ORAL
  Filled 2019-02-18 (×3): qty 1

## 2019-02-18 MED ORDER — CLONIDINE HCL 0.1 MG PO TABS
0.1000 mg | ORAL_TABLET | Freq: Four times a day (QID) | ORAL | Status: AC
  Administered 2019-02-18 – 2019-02-20 (×7): 0.1 mg via ORAL
  Filled 2019-02-18 (×8): qty 1

## 2019-02-18 MED ORDER — CLONIDINE HCL 0.1 MG PO TABS
0.1000 mg | ORAL_TABLET | Freq: Every day | ORAL | Status: AC
  Administered 2019-02-23 – 2019-02-24 (×2): 0.1 mg via ORAL
  Filled 2019-02-18 (×4): qty 1

## 2019-02-18 MED ORDER — HYDROXYZINE HCL 25 MG PO TABS
25.0000 mg | ORAL_TABLET | Freq: Four times a day (QID) | ORAL | Status: AC | PRN
  Administered 2019-02-18 – 2019-02-22 (×4): 25 mg via ORAL
  Filled 2019-02-18 (×4): qty 1

## 2019-02-18 MED ORDER — ZOLPIDEM TARTRATE 5 MG PO TABS
5.0000 mg | ORAL_TABLET | Freq: Every evening | ORAL | Status: DC | PRN
  Administered 2019-02-20 – 2019-02-23 (×4): 5 mg via ORAL
  Filled 2019-02-18 (×4): qty 1

## 2019-02-18 MED ORDER — LOPERAMIDE HCL 2 MG PO CAPS: 2.0000 mg | ORAL_CAPSULE | ORAL | Status: AC | PRN

## 2019-02-18 MED ORDER — ONDANSETRON 4 MG PO TBDP: 4.0000 mg | ORAL_TABLET | Freq: Four times a day (QID) | ORAL | Status: AC | PRN

## 2019-02-18 MED ORDER — DICYCLOMINE HCL 20 MG PO TABS
20.0000 mg | ORAL_TABLET | Freq: Four times a day (QID) | ORAL | Status: AC | PRN
  Administered 2019-02-22: 20 mg via ORAL
  Filled 2019-02-18 (×2): qty 1

## 2019-02-18 MED ORDER — SODIUM CHLORIDE 0.9 % IV SOLN
2.0000 g | INTRAVENOUS | Status: DC
  Filled 2019-02-18: qty 20

## 2019-02-18 NOTE — Progress Notes (Signed)
Cultures from [facility] show 1/2 sets of blood cultures positive for MRSA

## 2019-02-18 NOTE — Progress Notes (Signed)
Negative COVID test from [facility]

## 2019-02-18 NOTE — Progress Notes (Signed)
[facility]  Date of Admission:  02/17/2019      Total days of antibiotics 2  Vancomycin day 2  Ceftriaxone day 2           Patient ID: Tracy Fowler is a 23 y.o. female with  Principal Problem:   MRSA bacteremia Active Problems:   Septic embolism (HCC)   Injection of illicit drug within last 12 months   Transaminitis   Opioid dependence with withdrawal (HCC)    SUBJECTIVE: Starting to "withdraw pretty bad" today. Las injection of opioid was > 3 days ago. Reuses needles and has a h/o sharing with her previous boyfriend. Abdominal pain (also with menstrual cycle). Reports she is intolerant of suboxone (on day 3 or so she would develop abdominal pain/vomiting) but did well with Subutex. Ongoing sharp chest pain with drawing in a breath.   Still with left lower calf pain. Tender to the touch and uncomfortable to walk.   Interval Update - [facility] - preliminary results from blood cultures drawn 6/16 +MRSA.   Review of Systems: Review of Systems  Constitutional: Positive for chills and fever.  Respiratory: Positive for cough. Negative for shortness of breath.   Cardiovascular: Positive for chest pain.  Gastrointestinal: Positive for abdominal pain. Negative for diarrhea, nausea and vomiting.  Musculoskeletal: Negative for back pain.       LLE pain   Neurological: Negative for dizziness, tingling and focal weakness.  Psychiatric/Behavioral:       +withdrawal    Allergies  Allergen Reactions  . Prednisone Swelling    OBJECTIVE: Vitals:   02/17/19 1837 02/17/19 2100 02/17/19 2342 02/18/19 0753  BP:  (!) 135/97 131/88 (!) 138/96  Pulse:  (!) 110 (!) 109 98  Resp:  16 16 15   Temp:  97.7 F (36.5 C) 98.3 F (36.8 C) 98.5 F (36.9 C)  TempSrc:  Oral Oral Oral  SpO2:  98% 98% 98%  Weight: 61.7 kg     Height: 5\' 5"  (1.651 m)      Body mass index is 22.64 kg/m.  Physical Exam Vitals signs reviewed.   Constitutional:      Comments: Resting in bed. Twitching and fidgeting. Appears uncomfortable throughout most of our conversation. Better compared to yesterday.   Eyes:     General: No scleral icterus.    Pupils: Pupils are equal, round, and reactive to light.  Cardiovascular:     Rate and Rhythm: Tachycardia present.     Heart sounds: Murmur present.  Pulmonary:     Breath sounds: No wheezing.     Comments: Shallow inspiratory effort with tachypnea at times. Room air with adequate pulse ox.  Skin:    General: Skin is warm and dry.     Capillary Refill: Capillary refill takes less than 2 seconds.  Neurological:     Mental Status: She is alert and oriented to person, place, and time.     Lab Results Lab Results  Component Value Date   WBC 12.0 (H) 02/18/2019   HGB 12.2 02/18/2019   HCT 36.7 02/18/2019   MCV 87.8 02/18/2019   PLT 195 02/18/2019    Lab Results  Component Value Date   CREATININE 0.56 02/18/2019   BUN 7 02/18/2019   NA 134 (L) 02/18/2019   K 3.6 02/18/2019   CL 102 02/18/2019   CO2 23 02/18/2019    Lab Results  Component Value Date  ALT 91 (H) 02/18/2019   AST 51 (H) 02/18/2019   ALKPHOS 76 02/18/2019   BILITOT 1.7 (H) 02/18/2019     Microbiology: No results found for this or any previous visit (from the past 240 hour(s)).  ASSESSMENT & PLAN:   1. MRSA Bacteremia = will stop ceftriaxone. Continue vancomycin for appropriate AUC dosing. Repeat blood cultures are in progress. Hold on PICC line until negative x 48 hours if possible.   2. Septic Pulmonary Emboli = suspicious for endocarditis with bilateral and scattered pattern likely from central source. Pulmonary status stable for now. Informed her that it will take several weeks for the pain to ease off.   3. 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 We do not have one currently in progress in our  hospital however [facility] did obtain a negative test from 6/16. She would ultimately benefit from TEE however and would be the preferred study.   4. Opioid Dependence, Active Withdrawal = will d/w Dr. Wyline Copas to see what we can offer her for withdrawal management. She was intolerant to Suboxone in the past but did well on Subutex, which I believe we carry here.  5. Hepatitis C = confirmatory blood work has not been drawn yet - will check Hep C RNA and Hep B sAg. Can arrange treatment in the outpatient setting as her current MRSA infection takes strong precedent at this time. HIV Ab (-).   Janene Madeira, MSN, NP-C [facility] [email or web address] Pager: [phone] Office: [place]: [phone]   10:15 AM

## 2019-02-18 NOTE — Progress Notes (Signed)
PROGRESS NOTE    Tracy Fowler  LKG:[id number] DOB: [birth year] DOA: 02/17/2019 PCP: Patient, No Pcp Per    Brief Narrative:  23 y.o. female with past medical history significant for asthma and ongoing IVDU who last shot up last week was in her usual state of relatively poor health until 3 days ago when she noted onset of rib pain.  Patient initially thought that she had "pulled a rib muscle" however pain increased significantly and it became difficult to breathe secondary to pain.  Patient did have some associated shortness of breath but is unable to quantify exactly how much.  She also noted that she did have a cough however it was nonproductive.  Patient noted the cough was worsened when she was crying from the pain.  Patient admits to feeling hot and having some chills and sweats.  No chest pain.  No new lower extremity swelling.  No nausea vomiting diarrhea.  No hemoptysis.  Assessment & Plan:   Principal Problem:   MRSA bacteremia Active Problems:   Septic embolism (HCC)   Injection of illicit drug within last 12 months   Transaminitis   Opioid dependence with withdrawal (HCC)   1. MRSA bacteremia with embolic pulm emboli with sepsis present on admit 1. Septic emboli noted on CT chest 2. Discussed with ID, pt blood cx pos for MRSA 3. 2d echo ordered, pending 4. Cont on vanc 2. Asthma 1. No wheezing on exam 2. On minimal O2 support 3. IV drug abuse 1. Cessation done at bedside 2. Will initiate clonidine protocol  DVT prophylaxis: SCD's Code Status: Full Family Communication: Pt in room, family not at bedside Disposition Plan: Uncertain at this time  Consultants:   ID  Procedures:     Antimicrobials: Anti-infectives (From admission, onward)   Start     Dose/Rate Route Frequency Ordered Stop   02/18/19 1130  cefTRIAXone (ROCEPHIN) 2 g in sodium chloride 0.9 % 100 mL IVPB  Status:  Discontinued     2 g 200 mL/hr over 30 Minutes Intravenous Every 24 hours  02/18/19 0905 02/18/19 0936   02/18/19 0500  vancomycin (VANCOCIN) IVPB 1000 mg/200 mL premix     1,000 mg 200 mL/hr over 60 Minutes Intravenous Every 12 hours 02/17/19 1904     02/17/19 1930  piperacillin-tazobactam (ZOSYN) IVPB 3.375 g  Status:  Discontinued     3.375 g 12.5 mL/hr over 240 Minutes Intravenous Every 8 hours 02/17/19 1821 02/18/19 0905       Subjective: Complaining of shaking and pain  Objective: Vitals:   02/17/19 1837 02/17/19 2100 02/17/19 2342 02/18/19 0753  BP:  (!) 135/97 131/88 (!) 138/96  Pulse:  (!) 110 (!) 109 98  Resp:  16 16 15   Temp:  97.7 F (36.5 C) 98.3 F (36.8 C) 98.5 F (36.9 C)  TempSrc:  Oral Oral Oral  SpO2:  98% 98% 98%  Weight: 61.7 kg     Height: 5\' 5"  (1.651 m)      No intake or output data in the 24 hours ending 02/18/19 1309 Filed Weights   02/17/19 1837  Weight: 61.7 kg    Examination:  General exam: Appears calm and comfortable  Respiratory system: Clear to auscultation. Respiratory effort normal. Cardiovascular system: S1 & S2 heard, RRR Gastrointestinal system: Abdomen is nondistended, soft and nontender. No organomegaly or masses felt. Normal bowel sounds heard. Central nervous system: Alert and oriented. No focal neurological deficits. Extremities: Symmetric 5 x 5 power. Skin: No rashes,  lesions Psychiatry: Judgement and insight appear normal. Mood & affect appropriate.   Data Reviewed: I have personally reviewed following labs and imaging studies  CBC: Recent Labs  Lab 02/17/19 1535 02/18/19 0637  WBC 14.7* 12.0*  NEUTROABS 11.4*  --   HGB 12.4 12.2  HCT 38.5 36.7  MCV 90.6 87.8  PLT 205 811   Basic Metabolic Panel: Recent Labs  Lab 02/17/19 1535 02/18/19 0637  NA 133* 134*  K 3.6 3.6  CL 97* 102  CO2 23 23  GLUCOSE 87 107*  BUN 10 7  CREATININE 0.65 0.56  CALCIUM 8.6* 8.6*  MG 2.1  --    GFR: Estimated Creatinine Clearance: 96.7 mL/min (by C-G formula based on SCr of 0.56 mg/dL). Liver  Function Tests: Recent Labs  Lab 02/17/19 1535 02/18/19 0637  AST 63* 51*  ALT 108* 91*  ALKPHOS 77 76  BILITOT 1.9* 1.7*  PROT 8.1 7.8  ALBUMIN 3.2* 3.1*   No results for input(s): LIPASE, AMYLASE in the last 168 hours. No results for input(s): AMMONIA in the last 168 hours. Coagulation Profile: No results for input(s): INR, PROTIME in the last 168 hours. Cardiac Enzymes: No results for input(s): CKTOTAL, CKMB, CKMBINDEX, TROPONINI in the last 168 hours. BNP (last 3 results) No results for input(s): PROBNP in the last 8760 hours. HbA1C: No results for input(s): HGBA1C in the last 72 hours. CBG: No results for input(s): GLUCAP in the last 168 hours. Lipid Profile: No results for input(s): CHOL, HDL, LDLCALC, TRIG, CHOLHDL, LDLDIRECT in the last 72 hours. Thyroid Function Tests: No results for input(s): TSH, T4TOTAL, FREET4, T3FREE, THYROIDAB in the last 72 hours. Anemia Panel: No results for input(s): VITAMINB12, FOLATE, FERRITIN, TIBC, IRON, RETICCTPCT in the last 72 hours. Sepsis Labs: No results for input(s): PROCALCITON, LATICACIDVEN in the last 168 hours.  No results found for this or any previous visit (from the past 240 hour(s)).   Radiology Studies: No results found.  Scheduled Meds: Continuous Infusions: . sodium chloride 100 mL/hr at 02/17/19 1703  . vancomycin 1,000 mg (02/18/19 0641)     LOS: 1 day   Marylu Lund, MD [facility] Pager On Amion  If 7PM-7AM, please contact night-coverage 02/18/2019, 1:09 PM

## 2019-02-19 ENCOUNTER — Inpatient Hospital Stay (HOSPITAL_COMMUNITY): Payer: Self-pay

## 2019-02-19 DIAGNOSIS — M7989 Other specified soft tissue disorders: Secondary | ICD-10-CM

## 2019-02-19 LAB — COMPREHENSIVE METABOLIC PANEL
ALT: 70 U/L — ABNORMAL HIGH (ref 0–44)
AST: 40 U/L (ref 15–41)
Albumin: 2.7 g/dL — ABNORMAL LOW (ref 3.5–5.0)
Alkaline Phosphatase: 64 U/L (ref 38–126)
Anion gap: 8 (ref 5–15)
BUN: 12 mg/dL (ref 6–20)
CO2: 23 mmol/L (ref 22–32)
Calcium: 8.5 mg/dL — ABNORMAL LOW (ref 8.9–10.3)
Chloride: 106 mmol/L (ref 98–111)
Creatinine, Ser: 0.52 mg/dL (ref 0.44–1.00)
GFR calc Af Amer: >60 mL/min (ref >60)
GFR calc non Af Amer: >60 mL/min (ref >60)
Glucose, Bld: 126 mg/dL — ABNORMAL HIGH (ref 70–99)
Potassium: 3.8 mmol/L (ref 3.5–5.1)
Sodium: 137 mmol/L (ref 135–145)
Total Bilirubin: 0.8 mg/dL (ref 0.3–1.2)
Total Protein: 7.3 g/dL (ref 6.5–8.1)

## 2019-02-19 LAB — CBC
HCT: 35 % — ABNORMAL LOW (ref 36.0–46.0)
Hemoglobin: 11.6 g/dL — ABNORMAL LOW (ref 12.0–15.0)
MCH: 29.2 pg (ref 26.0–34.0)
MCHC: 33.1 g/dL (ref 30.0–36.0)
MCV: 88.2 fL (ref 80.0–100.0)
Platelets: 209 10*3/uL (ref 150–400)
RBC: 3.97 MIL/uL (ref 3.87–5.11)
RDW: 12.9 % (ref 11.5–15.5)
WBC: 10.1 10*3/uL (ref 4.0–10.5)
nRBC: 0 % (ref 0.0–0.2)

## 2019-02-19 LAB — VANCOMYCIN, PEAK: Vancomycin Pk: 34 ug/mL (ref 30–40)

## 2019-02-19 NOTE — Progress Notes (Signed)
Lower extremity venous has been completed.   Preliminary results in CV Proc.   Abram Sander 02/19/2019 3:10 PM

## 2019-02-19 NOTE — Progress Notes (Signed)
PROGRESS NOTE    Tracy Fowler  ZOX:[id number] DOB: July 08, 1996 DOA: 02/17/2019 PCP: Patient, No Pcp Per    Brief Narrative:  23 y.o. female with past medical history significant for asthma and ongoing IVDU who last shot up last week was in her usual state of relatively poor health until 3 days ago when she noted onset of rib pain.  Patient initially thought that she had "pulled a rib muscle" however pain increased significantly and it became difficult to breathe secondary to pain.  Patient did have some associated shortness of breath but is unable to quantify exactly how much.  She also noted that she did have a cough however it was nonproductive.  Patient noted the cough was worsened when she was crying from the pain.  Patient admits to feeling hot and having some chills and sweats.  No chest pain.  No new lower extremity swelling.  No nausea vomiting diarrhea.  No hemoptysis.  Assessment & Plan:   Principal Problem:   MRSA bacteremia Active Problems:   Septic embolism (HCC)   Injection of illicit drug within last 12 months   Transaminitis   Opioid dependence with withdrawal (HCC)   1. MRSA bacteremia with embolic pulm emboli with sepsis present on admit 1. Septic emboli noted on CT chest 2. Discussed with ID, pt blood cx pos for MRSA 3. ID recs noted with recommendation for TEE 4. Cont on vanc 2. Asthma 1. No wheezing on exam 2. Remains on minimal O2 support 3. IV drug abuse 1. Cessation was done at bedside 2. Pt is continued on clonidine protocol  DVT prophylaxis: SCD's Code Status: Full Family Communication: Pt in room, family not at bedside Disposition Plan: Uncertain at this time  Consultants:   ID  Procedures:     Antimicrobials: Anti-infectives (From admission, onward)   Start     Dose/Rate Route Frequency Ordered Stop   02/18/19 1130  cefTRIAXone (ROCEPHIN) 2 g in sodium chloride 0.9 % 100 mL IVPB  Status:  Discontinued     2 g 200 mL/hr over 30  Minutes Intravenous Every 24 hours 02/18/19 0905 02/18/19 0936   02/18/19 0500  vancomycin (VANCOCIN) IVPB 1000 mg/200 mL premix     1,000 mg 200 mL/hr over 60 Minutes Intravenous Every 12 hours 02/17/19 1904     02/17/19 1930  piperacillin-tazobactam (ZOSYN) IVPB 3.375 g  Status:  Discontinued     3.375 g 12.5 mL/hr over 240 Minutes Intravenous Every 8 hours 02/17/19 1821 02/18/19 0905      Subjective: Without complaints at this time  Objective: Vitals:   02/18/19 0753 02/18/19 1400 02/18/19 1734 02/19/19 0746  BP: (!) 138/96 (!) 147/89 140/90 129/89  Pulse: 98   86  Resp: 15   16  Temp: 98.5 F (36.9 C)   98.7 F (37.1 C)  TempSrc: Oral   Oral  SpO2: 98%   100%  Weight:      Height:        Intake/Output Summary (Last 24 hours) at 02/19/2019 1518 Last data filed at 02/19/2019 1058 Gross per 24 hour  Intake 240 ml  Output --  Net 240 ml   Filed Weights   02/17/19 1837  Weight: 61.7 kg    Examination: General exam: Awake, laying in bed, in nad Respiratory system: Normal respiratory effort, no wheezing Cardiovascular system: regular rate, s1, s2 Gastrointestinal system: Soft, nondistended, positive BS Central nervous system: CN2-12 grossly intact, strength intact Extremities: Perfused, no clubbing Skin: Normal skin  turgor, no notable skin lesions seen Psychiatry: Mood normal // no visual hallucinations   Data Reviewed: I have personally reviewed following labs and imaging studies  CBC: Recent Labs  Lab 02/17/19 1535 02/18/19 0637 02/19/19 0548  WBC 14.7* 12.0* 10.1  NEUTROABS 11.4*  --   --   HGB 12.4 12.2 11.6*  HCT 38.5 36.7 35.0*  MCV 90.6 87.8 88.2  PLT 205 195 209   Basic Metabolic Panel: Recent Labs  Lab 02/17/19 1535 02/18/19 0637 02/19/19 0548  NA 133* 134* 137  K 3.6 3.6 3.8  CL 97* 102 106  CO2 23 23 23   GLUCOSE 87 107* 126*  BUN 10 7 12   CREATININE 0.65 0.56 0.52  CALCIUM 8.6* 8.6* 8.5*  MG 2.1  --   --    GFR: Estimated  Creatinine Clearance: 96.7 mL/min (by C-G formula based on SCr of 0.52 mg/dL). Liver Function Tests: Recent Labs  Lab 02/17/19 1535 02/18/19 0637 02/19/19 0548  AST 63* 51* 40  ALT 108* 91* 70*  ALKPHOS 77 76 64  BILITOT 1.9* 1.7* 0.8  PROT 8.1 7.8 7.3  ALBUMIN 3.2* 3.1* 2.7*   No results for input(s): LIPASE, AMYLASE in the last 168 hours. No results for input(s): AMMONIA in the last 168 hours. Coagulation Profile: No results for input(s): INR, PROTIME in the last 168 hours. Cardiac Enzymes: No results for input(s): CKTOTAL, CKMB, CKMBINDEX, TROPONINI in the last 168 hours. BNP (last 3 results) No results for input(s): PROBNP in the last 8760 hours. HbA1C: No results for input(s): HGBA1C in the last 72 hours. CBG: No results for input(s): GLUCAP in the last 168 hours. Lipid Profile: No results for input(s): CHOL, HDL, LDLCALC, TRIG, CHOLHDL, LDLDIRECT in the last 72 hours. Thyroid Function Tests: No results for input(s): TSH, T4TOTAL, FREET4, T3FREE, THYROIDAB in the last 72 hours. Anemia Panel: No results for input(s): VITAMINB12, FOLATE, FERRITIN, TIBC, IRON, RETICCTPCT in the last 72 hours. Sepsis Labs: No results for input(s): PROCALCITON, LATICACIDVEN in the last 168 hours.  Recent Results (from the past 240 hour(s))  Culture, blood (routine x 2)     Status: None (Preliminary result)   Collection Time: 02/17/19  5:14 PM   Specimen: BLOOD RIGHT HAND  Result Value Ref Range Status   Specimen Description BLOOD RIGHT HAND  Final   Special Requests   Final    BOTTLES DRAWN AEROBIC AND ANAEROBIC Blood Culture results may not be optimal due to an inadequate volume of blood received in culture bottles   Culture   Final    NO GROWTH 2 DAYS Performed at [facility], [address] [place], [place] [address]    Report Status PENDING  Incomplete  Culture, blood (routine x 2)     Status: None (Preliminary result)   Collection Time: 02/17/19  5:16 PM   Specimen:  BLOOD  Result Value Ref Range Status   Specimen Description BLOOD RIGHT THUMB  Final   Special Requests   Final    BOTTLES DRAWN AEROBIC AND ANAEROBIC Blood Culture adequate volume   Culture   Final    NO GROWTH 2 DAYS Performed at [facility], [address] [place], [place] [address]    Report Status PENDING  Incomplete  Culture, blood (routine x 2)     Status: None (Preliminary result)   Collection Time: 02/18/19  6:37 AM   Specimen: BLOOD RIGHT HAND  Result Value Ref Range Status   Specimen Description BLOOD  RIGHT HAND  Final   Special Requests   Final    BOTTLES DRAWN AEROBIC ONLY Blood Culture results may not be optimal due to an excessive volume of blood received in culture bottles   Culture   Final    NO GROWTH 1 DAY Performed at [facility], [address] [place], [place] [address]    Report Status PENDING  Incomplete  Culture, blood (routine x 2)     Status: None (Preliminary result)   Collection Time: 02/18/19  6:47 AM   Specimen: BLOOD  Result Value Ref Range Status   Specimen Description BLOOD RIGHT ANTECUBITAL  Final   Special Requests   Final    BOTTLES DRAWN AEROBIC ONLY Blood Culture results may not be optimal due to an excessive volume of blood received in culture bottles   Culture   Final    NO GROWTH 1 DAY Performed at [facility], [address] [place], [place] [address]    Report Status PENDING  Incomplete     Radiology Studies: Vas Koreas Lower Extremity Venous (dvt)  Result Date: 02/19/2019  Lower Venous Study Indications: Swelling, and Pain.  Performing Technologist: Blanch MediaMegan Riddle RVS  Examination Guidelines: A complete evaluation includes B-mode imaging, spectral Doppler, color Doppler, and power Doppler as needed of all accessible portions of each vessel. Bilateral testing is considered an integral part of a complete examination. Limited examinations for reoccurring indications may be performed as noted.   +---------+---------------+---------+-----------+----------+--------------+  RIGHT     Compressibility Phasicity Spontaneity Properties Summary         +---------+---------------+---------+-----------+----------+--------------+  CFV       Full            Yes       Yes                                    +---------+---------------+---------+-----------+----------+--------------+  SFJ       Full                                                             +---------+---------------+---------+-----------+----------+--------------+  FV Prox   Full                                                             +---------+---------------+---------+-----------+----------+--------------+  FV Mid    Full                                                             +---------+---------------+---------+-----------+----------+--------------+  FV Distal Full                                                             +---------+---------------+---------+-----------+----------+--------------+  PFV       Full                                                             +---------+---------------+---------+-----------+----------+--------------+  POP       Full            Yes       Yes                                    +---------+---------------+---------+-----------+----------+--------------+  PTV       Full                                                             +---------+---------------+---------+-----------+----------+--------------+  PERO                                                       Not visualized  +---------+---------------+---------+-----------+----------+--------------+   +---------+---------------+---------+-----------+----------+-------+  LEFT      Compressibility Phasicity Spontaneity Properties Summary  +---------+---------------+---------+-----------+----------+-------+  CFV       Full            Yes       Yes                             +---------+---------------+---------+-----------+----------+-------+  SFJ        Full                                                      +---------+---------------+---------+-----------+----------+-------+  FV Prox   Full                                                      +---------+---------------+---------+-----------+----------+-------+  FV Mid    Full                                                      +---------+---------------+---------+-----------+----------+-------+  FV Distal Full                                                      +---------+---------------+---------+-----------+----------+-------+  PFV       Full                                                      +---------+---------------+---------+-----------+----------+-------+  POP       Full            Yes       Yes                             +---------+---------------+---------+-----------+----------+-------+  PTV       Full                                                      +---------+---------------+---------+-----------+----------+-------+  PERO      Full                                                      +---------+---------------+---------+-----------+----------+-------+     Summary: Right: There is no evidence of deep vein thrombosis in the lower extremity. No cystic structure found in the popliteal fossa. Left: There is no evidence of deep vein thrombosis in the lower extremity. No cystic structure found in the popliteal fossa.  *See table(s) above for measurements and observations.    Preliminary     Scheduled Meds:  cloNIDine  0.1 mg Oral QID   Followed by   Derrill Memo ON 02/20/2019] cloNIDine  0.1 mg Oral BH-qamhs   Followed by   Derrill Memo ON 02/23/2019] cloNIDine  0.1 mg Oral QAC breakfast   Continuous Infusions:  sodium chloride 100 mL/hr at 02/19/19 0008   vancomycin 1,000 mg (02/19/19 0523)     LOS: 2 days   Marylu Lund, MD [facility] Pager On Amion  If 7PM-7AM, please contact night-coverage 02/19/2019, 3:18 PM

## 2019-02-19 NOTE — Progress Notes (Signed)
         [facility] for Infectious Disease  Date of Admission:  02/17/2019      Total days of antibiotics 3  Vancomycin day 3             Patient ID: Tracy Fowler is a 23 y.o. female with  Principal Problem:   MRSA bacteremia Active Problems:   Septic embolism ([place])   Injection of illicit drug within last 12 months   Transaminitis   Opioid dependence with withdrawal ([place])  . cloNIDine  0.1 mg Oral QID   Followed by  . [START ON 02/20/2019] cloNIDine  0.1 mg Oral BH-qamhs   Followed by  . [START ON 02/23/2019] cloNIDine  0.1 mg Oral QAC breakfast    SUBJECTIVE: She is asleep in the bed soundly - I did not wake her.   Interim history:  No DVT or concern of LLE on ultrasound.   Review of Systems: Asleep  Allergies  Allergen Reactions  . Prednisone Swelling    OBJECTIVE: Vitals:   02/18/19 0753 02/18/19 1400 02/18/19 1734 02/19/19 0746  BP: (!) 138/96 (!) 147/89 140/90 129/89  Pulse: 98   86  Resp: 15   16  Temp: 98.5 F (36.9 C)   98.7 F (37.1 C)  TempSrc: Oral   Oral  SpO2: 98%   100%  Weight:      Height:       Body mass index is 22.64 kg/m.  Physical Exam Constitutional:      Comments: Sleeping soundly and breathing comfortably flat in bed with one pillow.      Lab Results Lab Results  Component Value Date   WBC 10.1 02/19/2019   HGB 11.6 (L) 02/19/2019   HCT 35.0 (L) 02/19/2019   MCV 88.2 02/19/2019   PLT 209 02/19/2019    Lab Results  Component Value Date   CREATININE 0.52 02/19/2019   BUN 12 02/19/2019   NA 137 02/19/2019   K 3.8 02/19/2019   CL 106 02/19/2019   CO2 23 02/19/2019    Lab Results  Component Value Date   ALT 70 (H) 02/19/2019   AST 40 02/19/2019   ALKPHOS 64 02/19/2019   BILITOT 0.8 02/19/2019     Microbiology: 6/16 BCx @ [facility] >> MRSA 6/17 BCx @ [facility] (after Vancomycin) >> negative x 1d  ASSESSMENT & PLAN:   1. MRSA Bacteremia = She has normalized white blood cell count and is  afebrile. Awaiting TEE to evaluate to help with length of therapy. Continue vancomycin for appropriate AUC dosing. Repeat blood cultures are in progress. Hold on PICC line until negative x 48 hours if possible.  2. Septic Pulmonary Emboli = suspicious for endocarditis with bilateral and scattered pattern likely from central source. Pulmonary status continues to be stable.  3. Suspected Right Sided Endocarditis = recommend TEE to evaluate for vegetation and valvular dysfunction.   4. Opioid Dependence, Active Withdrawal = will d/w Dr. Wyline Copas to see what we can offer her for withdrawal management. She was intolerant to Suboxone in the past but did well on Subutex, which I believe we carry here.  5. Hepatitis C = Confirmatory labs for chronic infection pending. HIV Ab (-).   Janene Madeira, MSN, NP-C [facility] for Infectious [email or web address] Pager: [phone] Office: [place]: [phone]   4:12 PM

## 2019-02-20 ENCOUNTER — Other Ambulatory Visit (HOSPITAL_COMMUNITY): Payer: Medicaid Other

## 2019-02-20 LAB — VANCOMYCIN, TROUGH: Vancomycin Tr: 4 ug/mL — ABNORMAL LOW (ref 15–20)

## 2019-02-20 LAB — HEPATITIS B SURFACE ANTIGEN: Hepatitis B Surface Ag: NEGATIVE

## 2019-02-20 MED ORDER — NICOTINE 14 MG/24HR TD PT24
14.0000 mg | MEDICATED_PATCH | Freq: Every day | TRANSDERMAL | Status: DC
  Administered 2019-02-20 – 2019-02-24 (×5): 14 mg via TRANSDERMAL
  Filled 2019-02-20 (×5): qty 1

## 2019-02-20 NOTE — Progress Notes (Signed)
Received call from [facility] about positive blood culture on patient.  One bottle resulted positive for staph aureus and MRSA.  Dr. Wyline Copas made aware.

## 2019-02-20 NOTE — Progress Notes (Signed)
Pharmacy Antibiotic Note  Tracy Fowler is a 23 y.o. female admitted on 02/17/2019 with r/o endocarditis.  Pharmacy has been consulted for vancomycin. Tmax is 100 and WBC is WNL. Scr is stable. A vancomycin peak was recorded at 34 yesterday and a vancomycin trough was 4 this AM but was drawn several hours late. Regimen is appropriate with calculated AUC of 436.    Plan: Continue vanc 1gm IV Q12H F/u renal fxn, C&S, clinical status and peak/trough at SS F/u TEE  Height: 5\' 5"  (165.1 cm) Weight: 136 lb 0.4 oz (61.7 kg) IBW/kg (Calculated) : 57  Temp (24hrs), Avg:99.3 F (37.4 C), Min:99 F (37.2 C), Max:100 F (37.8 C)  Recent Labs  Lab 02/17/19 1535 02/18/19 0637 02/19/19 0548 02/19/19 1950 02/20/19 0722  WBC 14.7* 12.0* 10.1  --   --   CREATININE 0.65 0.56 0.52  --   --   VANCOTROUGH  --   --   --   --  4*  VANCOPEAK  --   --   --  34  --     Estimated Creatinine Clearance: 96.7 mL/min (by C-G formula based on SCr of 0.52 mg/dL).    Allergies  Allergen Reactions  . Prednisone Swelling    Salome Arnt, PharmD, BCPS Please see AMION for all pharmacy numbers 02/20/2019 8:58 AM

## 2019-02-20 NOTE — TOC Initial Note (Signed)
Transition of Care [facility](TOC) - Initial/Assessment Note    Patient Details  Name: Tracy Fowler MRN: [id number] Date of Birth: Oct 17, 1995  Transition of Care [facility](TOC) CM/SW Contact:    Leone Havenaylor, Trapper Meech Clinton, RN Phone Number: 02/20/2019, 1:53 PM  Clinical Narrative:                 From home with her boyfriend, she states she will have transportation at dc.  She will need assistance with meds at dc , NCM will do Match at the time of dc for her and she will need a hospital [facility]. She will be here at the hospital til she finishes the 6 weeks of iv abx due to hx of   (ivdu).   Expected Discharge Plan: Home/Self Care Barriers to Discharge: No Barriers Identified   Patient Goals and CMS Choice Patient states their goals for this hospitalization and ongoing recovery are:: to get better and go home   Choice offered to / list presented to : NA  Expected Discharge Plan and Services Expected Discharge Plan: Home/Self Care In-house Referral: Financial Counselor Discharge Planning Services: CM Consult Post Acute Care Choice: NA Living arrangements for the past 2 months: Single Family Home                 DME Arranged: (NA)         HH Arranged: NA          Prior Living Arrangements/Services Living arrangements for the past 2 months: Single Family Home Lives with:: Significant Other Patient language and need for interpreter reviewed:: Yes        Need for Family Participation in Patient Care: No (Comment) Care giver support system in place?: No (comment)   Criminal Activity/Legal Involvement Pertinent to Current Situation/Hospitalization: No - Comment as needed  Activities of Daily Living Home Assistive Devices/Equipment: None ADL Screening (condition at time of admission) Patient's cognitive ability adequate to safely complete daily activities?: No Is the patient deaf or have difficulty hearing?: No Does the patient have difficulty seeing, even  when wearing glasses/contacts?: No Does the patient have difficulty concentrating, remembering, or making decisions?: No Patient able to express need for assistance with ADLs?: Yes Does the patient have difficulty dressing or bathing?: Yes Independently performs ADLs?: No Communication: Independent Is this a change from baseline?: Pre-admission baseline Dressing (OT): Needs assistance Is this a change from baseline?: Change from baseline, expected to last >3 days Grooming: Needs assistance Is this a change from baseline?: Change from baseline, expected to last >3 days Feeding: Independent Bathing: Needs assistance Is this a change from baseline?: Change from baseline, expected to last >3 days Toileting: Needs assistance Is this a change from baseline?: Change from baseline, expected to last >3days In/Out Bed: Needs assistance Is this a change from baseline?: Change from baseline, expected to last >3 days Walks in Home: Needs assistance Is this a change from baseline?: Change from baseline, expected to last >3 days Does the patient have difficulty walking or climbing stairs?: Yes Weakness of Legs: Both Weakness of Arms/Hands: None  Permission Sought/Granted                  Emotional Assessment Appearance:: Appears stated age Attitude/Demeanor/Rapport: Engaged Affect (typically observed): Appropriate Orientation: : Oriented to Self, Oriented to Place, Oriented to  Time, Oriented to Situation Alcohol / Substance Use: Tobacco Use Psych Involvement: No (comment)  Admission diagnosis:  Sepsis (HCC) [A41.9] Pulmonary  emboli [facility]) [I26.99] Patient Active Problem List   Diagnosis Date Noted  . MRSA bacteremia 02/18/2019  . Opioid dependence with withdrawal ([place]) 02/18/2019  . Septic embolism ([place]) 02/17/2019  . Injection of illicit drug within last 12 months 02/17/2019  . Transaminitis 02/17/2019   PCP:  Patient, No Pcp Per Pharmacy:   [id number] - [place], [place] [address] [place] [address] Phone: [phone] Fax: [phone]     Social Determinants of Health (SDOH) Interventions    Readmission Risk Interventions Readmission Risk Prevention Plan 02/20/2019  Medication Screening Complete  Transportation Screening Complete  Some recent data might be hidden

## 2019-02-20 NOTE — Plan of Care (Signed)
  Problem: Clinical Measurements: Goal: Ability to maintain clinical measurements within normal limits will improve Outcome: Progressing   Problem: Pain Managment: Goal: General experience of comfort will improve Outcome: Progressing   Problem: Safety: Goal: Ability to remain free from injury will improve Outcome: Progressing   

## 2019-02-20 NOTE — Progress Notes (Signed)
PROGRESS NOTE    Tracy Fowler  WUJ:[id number] DOB: May 27, 1996 DOA: 02/17/2019 PCP: Patient, No Pcp Per    Brief Narrative:  23 y.o. female with past medical history significant for asthma and ongoing IVDU who last shot up last week was in her usual state of relatively poor health until 3 days ago when she noted onset of rib pain.  Patient initially thought that she had "pulled a rib muscle" however pain increased significantly and it became difficult to breathe secondary to pain.  Patient did have some associated shortness of breath but is unable to quantify exactly how much.  She also noted that she did have a cough however it was nonproductive.  Patient noted the cough was worsened when she was crying from the pain.  Patient admits to feeling hot and having some chills and sweats.  No chest pain.  No new lower extremity swelling.  No nausea vomiting diarrhea.  No hemoptysis.  Assessment & Plan:   Principal Problem:   MRSA bacteremia Active Problems:   Septic embolism (HCC)   Injection of illicit drug within last 12 months   Transaminitis   Opioid dependence with withdrawal (HCC)   1. MRSA bacteremia with embolic pulm emboli with sepsis present on admit 1. Septic emboli noted on CT chest 2. Discussed with ID, pt blood cx pos for MRSA 3. ID recs noted with recommendation for TEE. TTE ordered, however echo tech will not perform study without documented covid test 4. Cont on vanc per Cardiology recs 2. Asthma 1. No wheezing on exam 2. Remains on minimal O2 support 3. Stable at this time 3. IV drug abuse 1. Cessation was done at bedside 2. Pt is continued on clonidine protocol 3. Appears comfortable  DVT prophylaxis: SCD's Code Status: Full Family Communication: Pt in room, family not at bedside Disposition Plan: Uncertain at this time  Consultants:   ID  Procedures:     Antimicrobials: Anti-infectives (From admission, onward)   Start     Dose/Rate Route Frequency  Ordered Stop   02/18/19 1130  cefTRIAXone (ROCEPHIN) 2 g in sodium chloride 0.9 % 100 mL IVPB  Status:  Discontinued     2 g 200 mL/hr over 30 Minutes Intravenous Every 24 hours 02/18/19 0905 02/18/19 0936   02/18/19 0500  vancomycin (VANCOCIN) IVPB 1000 mg/200 mL premix     1,000 mg 200 mL/hr over 60 Minutes Intravenous Every 12 hours 02/17/19 1904     02/17/19 1930  piperacillin-tazobactam (ZOSYN) IVPB 3.375 g  Status:  Discontinued     3.375 g 12.5 mL/hr over 240 Minutes Intravenous Every 8 hours 02/17/19 1821 02/18/19 0905      Subjective: No complaints at this time  Objective: Vitals:   02/19/19 0746 02/19/19 1654 02/19/19 2230 02/20/19 0805  BP: 129/89 139/86 114/73 127/79  Pulse: 86 96 85 78  Resp: 16 16 18 16   Temp: 98.7 F (37.1 C) 100 F (37.8 C) 99 F (37.2 C) 99 F (37.2 C)  TempSrc: Oral Oral Oral Oral  SpO2: 100% 100% 99% 100%  Weight:      Height:        Intake/Output Summary (Last 24 hours) at 02/20/2019 1311 Last data filed at 02/20/2019 0500 Gross per 24 hour  Intake 2870.26 ml  Output -  Net 2870.26 ml   Filed Weights   02/17/19 1837  Weight: 61.7 kg    Examination: General exam: Conversant, in no acute distress Respiratory system: normal chest rise, clear, no  audible wheezing Cardiovascular system: regular rhythm, s1-s2 Gastrointestinal system: Nondistended, nontender, pos BS Central nervous system: No seizures, no tremors Extremities: No cyanosis, no joint deformities Skin: No rashes, no pallor Psychiatry: Affect normal // no auditory hallucinations   Data Reviewed: I have personally reviewed following labs and imaging studies  CBC: Recent Labs  Lab 02/17/19 1535 02/18/19 0637 02/19/19 0548  WBC 14.7* 12.0* 10.1  NEUTROABS 11.4*  --   --   HGB 12.4 12.2 11.6*  HCT 38.5 36.7 35.0*  MCV 90.6 87.8 88.2  PLT 205 195 616   Basic Metabolic Panel: Recent Labs  Lab 02/17/19 1535 02/18/19 0637 02/19/19 0548  NA 133* 134* 137  K  3.6 3.6 3.8  CL 97* 102 106  CO2 23 23 23   GLUCOSE 87 107* 126*  BUN 10 7 12   CREATININE 0.65 0.56 0.52  CALCIUM 8.6* 8.6* 8.5*  MG 2.1  --   --    GFR: Estimated Creatinine Clearance: 96.7 mL/min (by C-G formula based on SCr of 0.52 mg/dL). Liver Function Tests: Recent Labs  Lab 02/17/19 1535 02/18/19 0637 02/19/19 0548  AST 63* 51* 40  ALT 108* 91* 70*  ALKPHOS 77 76 64  BILITOT 1.9* 1.7* 0.8  PROT 8.1 7.8 7.3  ALBUMIN 3.2* 3.1* 2.7*   No results for input(s): LIPASE, AMYLASE in the last 168 hours. No results for input(s): AMMONIA in the last 168 hours. Coagulation Profile: No results for input(s): INR, PROTIME in the last 168 hours. Cardiac Enzymes: No results for input(s): CKTOTAL, CKMB, CKMBINDEX, TROPONINI in the last 168 hours. BNP (last 3 results) No results for input(s): PROBNP in the last 8760 hours. HbA1C: No results for input(s): HGBA1C in the last 72 hours. CBG: No results for input(s): GLUCAP in the last 168 hours. Lipid Profile: No results for input(s): CHOL, HDL, LDLCALC, TRIG, CHOLHDL, LDLDIRECT in the last 72 hours. Thyroid Function Tests: No results for input(s): TSH, T4TOTAL, FREET4, T3FREE, THYROIDAB in the last 72 hours. Anemia Panel: No results for input(s): VITAMINB12, FOLATE, FERRITIN, TIBC, IRON, RETICCTPCT in the last 72 hours. Sepsis Labs: No results for input(s): PROCALCITON, LATICACIDVEN in the last 168 hours.  Recent Results (from the past 240 hour(s))  Culture, blood (routine x 2)     Status: None (Preliminary result)   Collection Time: 02/17/19  5:14 PM   Specimen: BLOOD RIGHT HAND  Result Value Ref Range Status   Specimen Description BLOOD RIGHT HAND  Final   Special Requests   Final    BOTTLES DRAWN AEROBIC AND ANAEROBIC Blood Culture results may not be optimal due to an inadequate volume of blood received in culture bottles   Culture   Final    NO GROWTH 3 DAYS Performed at [facility], [place] [address], [place],  [place] [address]    Report Status PENDING  Incomplete  Culture, blood (routine x 2)     Status: None (Preliminary result)   Collection Time: 02/17/19  5:16 PM   Specimen: BLOOD  Result Value Ref Range Status   Specimen Description BLOOD RIGHT THUMB  Final   Special Requests   Final    BOTTLES DRAWN AEROBIC AND ANAEROBIC Blood Culture adequate volume   Culture   Final    NO GROWTH 3 DAYS Performed at [facility], [place] [address] [place],  [address]    Report Status PENDING  Incomplete  Culture, blood (routine x 2)     Status: None (Preliminary result)  Collection Time: 02/18/19  6:37 AM   Specimen: BLOOD RIGHT HAND  Result Value Ref Range Status   Specimen Description BLOOD RIGHT HAND  Final   Special Requests   Final    BOTTLES DRAWN AEROBIC ONLY Blood Culture results may not be optimal due to an excessive volume of blood received in culture bottles   Culture   Final    NO GROWTH 2 DAYS Performed at [facility], [address] [place], [place] [address]    Report Status PENDING  Incomplete  Culture, blood (routine x 2)     Status: None (Preliminary result)   Collection Time: 02/18/19  6:47 AM   Specimen: BLOOD  Result Value Ref Range Status   Specimen Description BLOOD RIGHT ANTECUBITAL  Final   Special Requests   Final    BOTTLES DRAWN AEROBIC ONLY Blood Culture results may not be optimal due to an excessive volume of blood received in culture bottles   Culture   Final    NO GROWTH 2 DAYS Performed at [facility], [address] [place], [place] [address]    Report Status PENDING  Incomplete     Radiology Studies: Vas Koreas Lower Extremity Venous (dvt)  Result Date: 02/19/2019  Lower Venous Study Indications: Swelling, and Pain.  Performing Technologist: Blanch MediaMegan Riddle RVS  Examination Guidelines: A complete evaluation includes B-mode imaging, spectral Doppler, color Doppler, and power Doppler as needed of all accessible portions of each vessel.  Bilateral testing is considered an integral part of a complete examination. Limited examinations for reoccurring indications may be performed as noted.  +---------+---------------+---------+-----------+----------+--------------+ RIGHT    CompressibilityPhasicitySpontaneityPropertiesSummary        +---------+---------------+---------+-----------+----------+--------------+ CFV      Full           Yes      Yes                                 +---------+---------------+---------+-----------+----------+--------------+ SFJ      Full                                                        +---------+---------------+---------+-----------+----------+--------------+ FV Prox  Full                                                        +---------+---------------+---------+-----------+----------+--------------+ FV Mid   Full                                                        +---------+---------------+---------+-----------+----------+--------------+ FV DistalFull                                                        +---------+---------------+---------+-----------+----------+--------------+ PFV      Full                                                        +---------+---------------+---------+-----------+----------+--------------+  POP      Full           Yes      Yes                                 +---------+---------------+---------+-----------+----------+--------------+ PTV      Full                                                        +---------+---------------+---------+-----------+----------+--------------+ PERO                                                  Not visualized +---------+---------------+---------+-----------+----------+--------------+   +---------+---------------+---------+-----------+----------+-------+ LEFT     CompressibilityPhasicitySpontaneityPropertiesSummary  +---------+---------------+---------+-----------+----------+-------+ CFV      Full           Yes      Yes                          +---------+---------------+---------+-----------+----------+-------+ SFJ      Full                                                 +---------+---------------+---------+-----------+----------+-------+ FV Prox  Full                                                 +---------+---------------+---------+-----------+----------+-------+ FV Mid   Full                                                 +---------+---------------+---------+-----------+----------+-------+ FV DistalFull                                                 +---------+---------------+---------+-----------+----------+-------+ PFV      Full                                                 +---------+---------------+---------+-----------+----------+-------+ POP      Full           Yes      Yes                          +---------+---------------+---------+-----------+----------+-------+ PTV      Full                                                 +---------+---------------+---------+-----------+----------+-------+  PERO     Full                                                 +---------+---------------+---------+-----------+----------+-------+     Summary: Right: There is no evidence of deep vein thrombosis in the lower extremity. No cystic structure found in the popliteal fossa. Left: There is no evidence of deep vein thrombosis in the lower extremity. No cystic structure found in the popliteal fossa.  *See table(s) above for measurements and observations. Electronically signed by Lemar LivingsBrandon Cain MD on 02/19/2019 at 4:18:32 PM.    Final     Scheduled Meds: . cloNIDine  0.1 mg Oral QID   Followed by  . cloNIDine  0.1 mg Oral BH-qamhs   Followed by  . [START ON 02/23/2019] cloNIDine  0.1 mg Oral QAC breakfast   Continuous Infusions: . sodium chloride 100 mL/hr at  02/20/19 1248  . vancomycin 1,000 mg (02/20/19 0859)     LOS: 3 days   Rickey BarbaraStephen Chiu, MD [facility] Pager On Amion  If 7PM-7AM, please contact night-coverage 02/20/2019, 1:11 PM

## 2019-02-20 NOTE — Progress Notes (Signed)
[facility] for Infectious Disease  Date of Admission:  02/17/2019      Total days of antibiotics 4  Vancomycin day 4            Patient ID: Tracy Fowler is a 23 y.o. female with  Principal Problem:   MRSA bacteremia Active Problems:   Septic embolism ([place])   Injection of illicit drug within last 12 months   Transaminitis   Opioid dependence with withdrawal ([place])  . cloNIDine  0.1 mg Oral BH-qamhs   Followed by  . [START ON 02/23/2019] cloNIDine  0.1 mg Oral QAC breakfast  . nicotine  14 mg Transdermal Daily    SUBJECTIVE: She is asleep in the bed soundly - I did not wake her.   Interim history:  No DVT or concern of LLE on ultrasound.   Review of Systems: Asleep  Allergies  Allergen Reactions  . Prednisone Swelling    OBJECTIVE: Vitals:   02/19/19 1654 02/19/19 2230 02/20/19 0805 02/20/19 1635  BP: 139/86 114/73 127/79 129/83  Pulse: 96 85 78   Resp: 16 18 16  (!) 32  Temp: 100 F (37.8 C) 99 F (37.2 C) 99 F (37.2 C) 98.5 F (36.9 C)  TempSrc: Oral Oral Oral Oral  SpO2: 100% 99% 100% 100%  Weight:      Height:       Body mass index is 22.64 kg/m.  Physical Exam Vitals signs reviewed.  Constitutional:      Comments: Resting better at the time of my assessment in bed. Still uncomfortable at times.   HENT:     Mouth/Throat:     Mouth: Mucous membranes are dry.     Pharynx: No oropharyngeal exudate.  Cardiovascular:     Rate and Rhythm: Normal rate and regular rhythm.     Heart sounds: No murmur.  Pulmonary:     Comments: Shallow respiratory effort. Room air  Abdominal:     General: Abdomen is flat. Bowel sounds are normal.  Musculoskeletal: Normal range of motion.  Skin:    General: Skin is warm and dry.     Capillary Refill: Capillary refill takes less than 2 seconds.  Neurological:     Mental Status: She is alert.     Lab Results Lab Results  Component Value Date   WBC 10.1 02/19/2019   HGB 11.6 (L) 02/19/2019   HCT 35.0 (L) 02/19/2019   MCV 88.2 02/19/2019   PLT 209 02/19/2019    Lab Results  Component Value Date   CREATININE 0.52 02/19/2019   BUN 12 02/19/2019   NA 137 02/19/2019   K 3.8 02/19/2019   CL 106 02/19/2019   CO2 23 02/19/2019    Lab Results  Component Value Date   ALT 70 (H) 02/19/2019   AST 40 02/19/2019   ALKPHOS 64 02/19/2019   BILITOT 0.8 02/19/2019     Microbiology: 6/16 BCx @ [facility] >> MRSA  6/17 BCx @ [facility] (after Vancomycin) >> negative x 2d  ASSESSMENT & PLAN:   1. MRSA Bacteremia = She has normalized white blood cell count and is afebrile. Awaiting TEE to evaluate to help with length of therapy - D/W Dr. Wyline Copas and will reorder TTE to evaluate for large vegetations now. Continue vancomycin for appropriate AUC dosing. Repeat blood cultures are in progress. Hold on PICC line until negative x 48 hours if possible.  2. Septic Pulmonary Emboli = suspicious for endocarditis with bilateral  and scattered pattern likely from central source. Pulmonary status continues to be stable. TTE to be obtained over W/E to evaluate.   3. Suspected Right Sided Endocarditis = recommend TEE to evaluate for vegetation and valvular dysfunction.   4. Opioid Dependence, Active Withdrawal = better  5. Hepatitis C = Confirmatory labs for chronic infection pending. HIV Ab (-).   Rexene AlbertsStephanie Deven Audi, MSN, NP-C [facility]  [email or web address] Pager: [phone] Office: [phone] [facility] Main Line: [phone]   5:26 PM

## 2019-02-21 ENCOUNTER — Inpatient Hospital Stay (HOSPITAL_COMMUNITY): Payer: Self-pay

## 2019-02-21 DIAGNOSIS — I361 Nonrheumatic tricuspid (valve) insufficiency: Secondary | ICD-10-CM

## 2019-02-21 LAB — ECHOCARDIOGRAM COMPLETE
Height: 65 in
Weight: 2176.38 oz

## 2019-02-21 NOTE — Progress Notes (Addendum)
PROGRESS NOTE    Tracy Fowler  MWN:[id number] DOB: [birth year] DOA: 02/17/2019 PCP: Patient, No Pcp Per    Brief Narrative:  23 y.o. female with past medical history significant for asthma and ongoing IVDU who last shot up last week was in her usual state of relatively poor health until 3 days ago when she noted onset of rib pain.  Patient initially thought that she had "pulled a rib muscle" however pain increased significantly and it became difficult to breathe secondary to pain.  Patient did have some associated shortness of breath but is unable to quantify exactly how much.  She also noted that she did have a cough however it was nonproductive.  Patient noted the cough was worsened when she was crying from the pain.  Patient admits to feeling hot and having some chills and sweats.  No chest pain.  No new lower extremity swelling.  No nausea vomiting diarrhea.  No hemoptysis.  Assessment & Plan:   Principal Problem:   MRSA bacteremia Active Problems:   Septic embolism (H[facility])   Injection of illicit drug within last 12 months   Transaminitis   Opioid dependence with withdrawal ([facility])   1. MRSA bacteremia with embolic pulm emboli with sepsis present on admit 1. Septic emboli noted on CT chest 2. Discussed with ID, pt blood cx pos for MRSA 3. ID recs noted with recommendation for TEE. TTE performed and results reviewed. Findings worrisome for mild thickening of the mitral valve and increased thickness on the R ventricular surface of the tricuspid valve leaflets. Per Cardiology, recommendation for f/u TEE. Had already spoken with Cardiology on 6/19 4. Patient is continued on vanc per Cardiology recs 2. Asthma 1. No wheezing on exam 2. Remains on minimal O2 support 3. Remains stable at this time 3. IV drug abuse 1. Cessation was done at bedside 2. Pt is continued on clonidine protocol 3. Currently stable  DVT prophylaxis: SCD's Code Status: Full Family Communication: Pt in room,  family not at bedside Disposition Plan: Uncertain at this time  Consultants:   ID  Procedures:     Antimicrobials: Anti-infectives (From admission, onward)   Start     Dose/Rate Route Frequency Ordered Stop   02/18/19 1130  cefTRIAXone (ROCEPHIN) 2 g in sodium chloride 0.9 % 100 mL IVPB  Status:  Discontinued     2 g 200 mL/hr over 30 Minutes Intravenous Every 24 hours 02/18/19 0905 02/18/19 0936   02/18/19 0500  vancomycin (VANCOCIN) IVPB 1000 mg/200 mL premix     1,000 mg 200 mL/hr over 60 Minutes Intravenous Every 12 hours 02/17/19 1904     02/17/19 1930  piperacillin-tazobactam (ZOSYN) IVPB 3.375 g  Status:  Discontinued     3.375 g 12.5 mL/hr over 240 Minutes Intravenous Every 8 hours 02/17/19 1821 02/18/19 0905      Subjective: Complains of mild neck discomfort improved with ice cream  Objective: Vitals:   02/20/19 1731 02/20/19 2007 02/20/19 2300 02/21/19 0827  BP:  135/84 131/85 (!) 140/93  Pulse:  97 99 88  Resp: (!) 37     Temp:  98.8 F (37.1 C) 99.2 F (37.3 C) 99.1 F (37.3 C)  TempSrc:  Oral Oral Oral  SpO2:  100% 95% 100%  Weight:      Height:       No intake or output data in the 24 hours ending 02/21/19 1514 Filed Weights   02/17/19 1837  Weight: 61.7 kg    Examination: General  exam: Awake, laying in bed, in nad Respiratory system: Normal respiratory effort, no wheezing Cardiovascular system: regular rate, s1, s2 Gastrointestinal system: Soft, nondistended, positive BS Central nervous system: CN2-12 grossly intact, strength intact Extremities: Perfused, no clubbing Skin: Normal skin turgor, no notable skin lesions seen Psychiatry: Mood normal // no visual hallucinations   Data Reviewed: I have personally reviewed following labs and imaging studies  CBC: Recent Labs  Lab 02/17/19 1535 02/18/19 0637 02/19/19 0548  WBC 14.7* 12.0* 10.1  NEUTROABS 11.4*  --   --   HGB 12.4 12.2 11.6*  HCT 38.5 36.7 35.0*  MCV 90.6 87.8 88.2  PLT  205 195 332   Basic Metabolic Panel: Recent Labs  Lab 02/17/19 1535 02/18/19 0637 02/19/19 0548  NA 133* 134* 137  K 3.6 3.6 3.8  CL 97* 102 106  CO2 23 23 23   GLUCOSE 87 107* 126*  BUN 10 7 12   CREATININE 0.65 0.56 0.52  CALCIUM 8.6* 8.6* 8.5*  MG 2.1  --   --    GFR: Estimated Creatinine Clearance: 96.7 mL/min (by C-G formula based on SCr of 0.52 mg/dL). Liver Function Tests: Recent Labs  Lab 02/17/19 1535 02/18/19 0637 02/19/19 0548  AST 63* 51* 40  ALT 108* 91* 70*  ALKPHOS 77 76 64  BILITOT 1.9* 1.7* 0.8  PROT 8.1 7.8 7.3  ALBUMIN 3.2* 3.1* 2.7*   No results for input(s): LIPASE, AMYLASE in the last 168 hours. No results for input(s): AMMONIA in the last 168 hours. Coagulation Profile: No results for input(s): INR, PROTIME in the last 168 hours. Cardiac Enzymes: No results for input(s): CKTOTAL, CKMB, CKMBINDEX, TROPONINI in the last 168 hours. BNP (last 3 results) No results for input(s): PROBNP in the last 8760 hours. HbA1C: No results for input(s): HGBA1C in the last 72 hours. CBG: No results for input(s): GLUCAP in the last 168 hours. Lipid Profile: No results for input(s): CHOL, HDL, LDLCALC, TRIG, CHOLHDL, LDLDIRECT in the last 72 hours. Thyroid Function Tests: No results for input(s): TSH, T4TOTAL, FREET4, T3FREE, THYROIDAB in the last 72 hours. Anemia Panel: No results for input(s): VITAMINB12, FOLATE, FERRITIN, TIBC, IRON, RETICCTPCT in the last 72 hours. Sepsis Labs: No results for input(s): PROCALCITON, LATICACIDVEN in the last 168 hours.  Recent Results (from the past 240 hour(s))  Culture, blood (routine x 2)     Status: None (Preliminary result)   Collection Time: 02/17/19  5:14 PM   Specimen: BLOOD RIGHT HAND  Result Value Ref Range Status   Specimen Description BLOOD RIGHT HAND  Final   Special Requests   Final    BOTTLES DRAWN AEROBIC AND ANAEROBIC Blood Culture results may not be optimal due to an inadequate volume of blood received  in culture bottles   Culture   Final    NO GROWTH 4 DAYS Performed at [facility], [place] [address], [place], [place] [address]    Report Status PENDING  Incomplete  Culture, blood (routine x 2)     Status: None (Preliminary result)   Collection Time: 02/17/19  5:16 PM   Specimen: BLOOD  Result Value Ref Range Status   Specimen Description BLOOD RIGHT THUMB  Final   Special Requests   Final    BOTTLES DRAWN AEROBIC AND ANAEROBIC Blood Culture adequate volume   Culture   Final    NO GROWTH 4 DAYS Performed at [facility], [place] [address] [place], [place] [address]    Report Status PENDING  Incomplete  Culture, blood (routine x 2)     Status: None (Preliminary result)   Collection Time: 02/18/19  6:37 AM   Specimen: BLOOD RIGHT HAND  Result Value Ref Range Status   Specimen Description BLOOD RIGHT HAND  Final   Special Requests   Final    BOTTLES DRAWN AEROBIC ONLY Blood Culture results may not be optimal due to an excessive volume of blood received in culture bottles   Culture   Final    NO GROWTH 3 DAYS Performed at [facility], [address] [place], [place] [address]    Report Status PENDING  Incomplete  Culture, blood (routine x 2)     Status: None (Preliminary result)   Collection Time: 02/18/19  6:47 AM   Specimen: BLOOD  Result Value Ref Range Status   Specimen Description BLOOD RIGHT ANTECUBITAL  Final   Special Requests   Final    BOTTLES DRAWN AEROBIC ONLY Blood Culture results may not be optimal due to an excessive volume of blood received in culture bottles   Culture   Final    NO GROWTH 3 DAYS Performed at [facility], [address] [place], [place] [address]    Report Status PENDING  Incomplete     Radiology Studies: No results found.  Scheduled Meds: . cloNIDine  0.1 mg Oral BH-qamhs   Followed by  . [START ON 02/23/2019] cloNIDine  0.1 mg Oral QAC breakfast  . nicotine  14 mg Transdermal Daily   Continuous Infusions: .  sodium chloride 100 mL/hr at 02/21/19 1451  . vancomycin 1,000 mg (02/21/19 0530)     LOS: 4 days   Rickey BarbaraStephen Icarus Partch, MD [facility] Pager On Amion  If 7PM-7AM, please contact night-coverage 02/21/2019, 3:14 PM

## 2019-02-21 NOTE — Progress Notes (Signed)
  Echocardiogram 2D Echocardiogram has been performed.  Tracy Fowler 02/21/2019, 9:23 AM

## 2019-02-22 LAB — CULTURE, BLOOD (ROUTINE X 2)
Culture: NO GROWTH
Culture: NO GROWTH
Special Requests: ADEQUATE

## 2019-02-22 LAB — VANCOMYCIN, PEAK: Vancomycin Pk: 28 ug/mL — ABNORMAL LOW (ref 30–40)

## 2019-02-22 NOTE — Progress Notes (Signed)
PROGRESS NOTE    Tracy Fowler  [id number] DOB: [birth year] DOA: 02/17/2019 PCP: Patient, No Pcp Per    Brief Narrative:  23 y.o. female with past medical history significant for asthma and ongoing IVDU who last shot up last week was in her usual state of relatively poor health until 3 days ago when she noted onset of rib pain.  Patient initially thought that she had "pulled a rib muscle" however pain increased significantly and it became difficult to breathe secondary to pain.  Patient did have some associated shortness of breath but is unable to quantify exactly how much.  She also noted that she did have a cough however it was nonproductive.  Patient noted the cough was worsened when she was crying from the pain.  Patient admits to feeling hot and having some chills and sweats.  No chest pain.  No new lower extremity swelling.  No nausea vomiting diarrhea.  No hemoptysis.  Assessment & Plan:   Principal Problem:   MRSA bacteremia Active Problems:   Septic embolism (H[facility])   Injection of illicit drug within last 12 months   Transaminitis   Opioid dependence with withdrawal ([facility])   1. MRSA bacteremia with embolic pulm emboli with sepsis present on admit 1. Septic emboli noted on CT chest 2. Discussed with ID, pt blood cx pos for MRSA 3. ID recs noted with recommendation for TEE. TTE performed and results reviewed. Findings worrisome for mild thickening of the mitral valve and increased thickness on the R ventricular surface of the tricuspid valve leaflets. Per Cardiology, recommendation for f/u TEE. Had already spoken with Cardiology on 6/19 regarding TEE, pending 4. Patient is continued on vanc per Cardiology recs 5. Seems stable at this time 2. Asthma 1. No wheezing on exam 2. Remains on minimal O2 support 3. Stable currently 3. IV drug abuse 1. Cessation was done at bedside 2. Pt is continued on clonidine protocol 3. Presently stable  DVT prophylaxis: SCD's Code Status:  Full Family Communication: Pt in room, family not at bedside Disposition Plan: Uncertain at this time  Consultants:   ID  Procedures:     Antimicrobials: Anti-infectives (From admission, onward)   Start     Dose/Rate Route Frequency Ordered Stop   02/18/19 1130  cefTRIAXone (ROCEPHIN) 2 g in sodium chloride 0.9 % 100 mL IVPB  Status:  Discontinued     2 g 200 mL/hr over 30 Minutes Intravenous Every 24 hours 02/18/19 0905 02/18/19 0936   02/18/19 0500  vancomycin (VANCOCIN) IVPB 1000 mg/200 mL premix     1,000 mg 200 mL/hr over 60 Minutes Intravenous Every 12 hours 02/17/19 1904     02/17/19 1930  piperacillin-tazobactam (ZOSYN) IVPB 3.375 g  Status:  Discontinued     3.375 g 12.5 mL/hr over 240 Minutes Intravenous Every 8 hours 02/17/19 1821 02/18/19 0905      Subjective: In good spirits this AM  Objective: Vitals:   02/21/19 1735 02/21/19 1835 02/21/19 2245 02/22/19 0746  BP:   122/80 (!) 130/96  Pulse:   91 87  Resp: (!) 28 (!) 27 17 16   Temp:   97.7 F (36.5 C) 98.6 F (37 C)  TempSrc:   Oral Oral  SpO2:   100% 100%  Weight:      Height:       No intake or output data in the 24 hours ending 02/22/19 1602 Filed Weights   02/17/19 1837  Weight: 61.7 kg    Examination: General  exam: Conversant, in no acute distress Respiratory system: normal chest rise, clear, no audible wheezing Cardiovascular system: regular rhythm, s1-s2 Gastrointestinal system: Nondistended, nontender, pos BS Central nervous system: No seizures, no tremors Extremities: No cyanosis, no joint deformities Skin: No rashes, no pallor, multiple track marks Psychiatry: Affect normal // no auditory hallucinations   Data Reviewed: I have personally reviewed following labs and imaging studies  CBC: Recent Labs  Lab 02/17/19 1535 02/18/19 0637 02/19/19 0548  WBC 14.7* 12.0* 10.1  NEUTROABS 11.4*  --   --   HGB 12.4 12.2 11.6*  HCT 38.5 36.7 35.0*  MCV 90.6 87.8 88.2  PLT 205 195 209    Basic Metabolic Panel: Recent Labs  Lab 02/17/19 1535 02/18/19 0637 02/19/19 0548  NA 133* 134* 137  K 3.6 3.6 3.8  CL 97* 102 106  CO2 23 23 23   GLUCOSE 87 107* 126*  BUN 10 7 12   CREATININE 0.65 0.56 0.52  CALCIUM 8.6* 8.6* 8.5*  MG 2.1  --   --    GFR: Estimated Creatinine Clearance: 96.7 mL/min (by C-G formula based on SCr of 0.52 mg/dL). Liver Function Tests: Recent Labs  Lab 02/17/19 1535 02/18/19 0637 02/19/19 0548  AST 63* 51* 40  ALT 108* 91* 70*  ALKPHOS 77 76 64  BILITOT 1.9* 1.7* 0.8  PROT 8.1 7.8 7.3  ALBUMIN 3.2* 3.1* 2.7*   No results for input(s): LIPASE, AMYLASE in the last 168 hours. No results for input(s): AMMONIA in the last 168 hours. Coagulation Profile: No results for input(s): INR, PROTIME in the last 168 hours. Cardiac Enzymes: No results for input(s): CKTOTAL, CKMB, CKMBINDEX, TROPONINI in the last 168 hours. BNP (last 3 results) No results for input(s): PROBNP in the last 8760 hours. HbA1C: No results for input(s): HGBA1C in the last 72 hours. CBG: No results for input(s): GLUCAP in the last 168 hours. Lipid Profile: No results for input(s): CHOL, HDL, LDLCALC, TRIG, CHOLHDL, LDLDIRECT in the last 72 hours. Thyroid Function Tests: No results for input(s): TSH, T4TOTAL, FREET4, T3FREE, THYROIDAB in the last 72 hours. Anemia Panel: No results for input(s): VITAMINB12, FOLATE, FERRITIN, TIBC, IRON, RETICCTPCT in the last 72 hours. Sepsis Labs: No results for input(s): PROCALCITON, LATICACIDVEN in the last 168 hours.  Recent Results (from the past 240 hour(s))  Culture, blood (routine x 2)     Status: None   Collection Time: 02/17/19  5:14 PM   Specimen: BLOOD RIGHT HAND  Result Value Ref Range Status   Specimen Description BLOOD RIGHT HAND  Final   Special Requests   Final    BOTTLES DRAWN AEROBIC AND ANAEROBIC Blood Culture results may not be optimal due to an inadequate volume of blood received in culture bottles   Culture    Final    NO GROWTH 5 DAYS Performed at [facility], [address] [place], [place] [address]    Report Status 02/22/2019 FINAL  Final  Culture, blood (routine x 2)     Status: None   Collection Time: 02/17/19  5:16 PM   Specimen: BLOOD  Result Value Ref Range Status   Specimen Description BLOOD RIGHT THUMB  Final   Special Requests   Final    BOTTLES DRAWN AEROBIC AND ANAEROBIC Blood Culture adequate volume   Culture   Final    NO GROWTH 5 DAYS Performed at [facility], [address] [place], [place] [address]    Report Status 02/22/2019 FINAL  Final  Culture, blood (routine x 2)     Status: None (Preliminary result)   Collection Time: 02/18/19  6:37 AM   Specimen: BLOOD RIGHT HAND  Result Value Ref Range Status   Specimen Description BLOOD RIGHT HAND  Final   Special Requests   Final    BOTTLES DRAWN AEROBIC ONLY Blood Culture results may not be optimal due to an excessive volume of blood received in culture bottles   Culture   Final    NO GROWTH 4 DAYS Performed at [facility], [place] [address] [place], [place] [address]    Report Status PENDING  Incomplete  Culture, blood (routine x 2)     Status: None (Preliminary result)   Collection Time: 02/18/19  6:47 AM   Specimen: BLOOD  Result Value Ref Range Status   Specimen Description BLOOD RIGHT ANTECUBITAL  Final   Special Requests   Final    BOTTLES DRAWN AEROBIC ONLY Blood Culture results may not be optimal due to an excessive volume of blood received in culture bottles   Culture   Final    NO GROWTH 4 DAYS Performed at [facility], [place] [address] [place], [place] [address]    Report Status PENDING  Incomplete     Radiology Studies: No results found.  Scheduled Meds: . [START ON 02/23/2019] cloNIDine  0.1 mg Oral QAC breakfast  . nicotine  14 mg Transdermal Daily   Continuous Infusions: . sodium chloride 100 mL/hr at 02/22/19 0926  . vancomycin 1,000 mg (02/22/19 0620)     LOS: 5  days   Marylu Lund, MD [facility] Pager On Amion  If 7PM-7AM, please contact night-coverage 02/22/2019, 4:02 PM

## 2019-02-22 NOTE — Progress Notes (Signed)
Patient alert and oriented x 4, complains of mild to moderate pain to right rib cage and neck, relieved with ordered pain medications.  No sign or symptoms observed or verbalized of withdrawal per COW's assessment.  Verbalized that she will keep her boyfriend and mother updated with her care.  Stable condition at end of shift, will continue to monitor.

## 2019-02-22 NOTE — Progress Notes (Signed)
Chaplain responded to request for prayer, facilitated storytelling and practiced active listening. Pt described 5 year struggle with addiction and her desire to try to become free of drugs.  A close friend died this past week of an overdose and her best friend died this past 11/20/2022 from the same condition that pt has. Pt became teary describing the events leading up to these losses. Pt is thoughtful and shows some self-awareness and insight about her struggle with addiction. She is generally spiritual but does not identify with any one religious group. By request, chaplain offered prayer, including anointing with oil. Pt again became teary. Chaplain encouraged self-care, ongoing openness to ask for help, and learning about 12 Step as one option for support in recovery.     Pt would benefit from resources and education about recovery (perhaps social work support). Note: Pt's mother and stepfather are active drug users (mother, for patient's whole life), as is a close friend ("ex-boyfriend") named [place], although [place] has indicated a desire to join pt in the effort to recover. Chaplain will refer pt for follow up care by Spiritual Care.  [place], [place]     02/22/19 1735  Clinical Encounter Type  Visited With Patient  Visit Type Initial;Spiritual support;Psychological support  Referral From Patient  Consult/Referral To Chaplain  Recommendations Support services for addiction recovery  Stress Factors  Patient Stress Factors Family relationships;Health changes;Loss

## 2019-02-23 ENCOUNTER — Encounter (HOSPITAL_COMMUNITY): Payer: Self-pay | Admitting: Anesthesiology

## 2019-02-23 LAB — CREATININE, SERUM
Creatinine, Ser: 0.74 mg/dL (ref 0.44–1.00)
GFR calc Af Amer: >60 mL/min (ref >60)
GFR calc non Af Amer: >60 mL/min (ref >60)

## 2019-02-23 LAB — CULTURE, BLOOD (ROUTINE X 2)
Culture: NO GROWTH
Culture: NO GROWTH

## 2019-02-23 LAB — HEPATITIS C VRS RNA DETECT BY PCR-QUAL: Hepatitis C Vrs RNA by PCR-Qual: POSITIVE — AB

## 2019-02-23 LAB — GLUCOSE, CAPILLARY: Glucose-Capillary: 96 mg/dL (ref 70–99)

## 2019-02-23 LAB — VANCOMYCIN, TROUGH: Vancomycin Tr: 7 ug/mL — ABNORMAL LOW (ref 15–20)

## 2019-02-23 MED ORDER — HYDROXYZINE HCL 10 MG PO TABS
10.0000 mg | ORAL_TABLET | Freq: Three times a day (TID) | ORAL | Status: DC | PRN
  Administered 2019-02-23 – 2019-02-24 (×2): 10 mg via ORAL
  Filled 2019-02-23 (×3): qty 1

## 2019-02-23 NOTE — Progress Notes (Addendum)
PROGRESS NOTE    Tracy Fowler  JME:[id number] DOB: 09-Aug-1996 DOA: 02/17/2019 PCP: Patient, No Pcp Per    Brief Narrative:  23 y.o. female with past medical history significant for asthma and ongoing IVDU who last shot up last week was in her usual state of relatively poor health until 3 days ago when she noted onset of rib pain.  Patient initially thought that she had "pulled a rib muscle" however pain increased significantly and it became difficult to breathe secondary to pain.  Patient did have some associated shortness of breath but is unable to quantify exactly how much.  She also noted that she did have a cough however it was nonproductive.  Patient noted the cough was worsened when she was crying from the pain.  Patient admits to feeling hot and having some chills and sweats.  No chest pain.  No new lower extremity swelling.  No nausea vomiting diarrhea.  No hemoptysis.  Assessment & Plan:   Principal Problem:   MRSA bacteremia Active Problems:   Septic embolism ([place])   Injection of illicit drug within last 12 months   Transaminitis   Opioid dependence with withdrawal ([place])   1. MRSA bacteremia with embolic pulm emboli with sepsis present on admit 1. Septic emboli noted on CT chest 2. Blood cx pos for MRSA 3. ID recs noted with recommendation for TEE. TTE performed and results reviewed. Findings worrisome for mild thickening of the mitral valve and increased thickness on the R ventricular surface of the tricuspid valve leaflets. Per Cardiology, recommendation for f/u TEE.  4. Have spoken with Cardiology on 6/19 regarding need for TEE, discussed with Cardiology today, pt is planned for TEE on 6/24 5. Patient is continued on vanc per ID recs 6. Currently stable 2. Asthma 1. No wheezing on exam 2. Remains on minimal O2 support 3. Currently stable 3. IV drug abuse 1. Cessation was done at bedside 2. Pt is continued on clonidine protocol 3. Stable at this time. Patient  seems very motivated to quit her addiction, congratulated 4. Have consulted CM to provide drug resource programs  DVT prophylaxis: SCD's Code Status: Full Family Communication: Pt in room, family not at bedside Disposition Plan: Uncertain at this time  Consultants:   ID  Procedures:     Antimicrobials: Anti-infectives (From admission, onward)   Start     Dose/Rate Route Frequency Ordered Stop   02/18/19 1130  cefTRIAXone (ROCEPHIN) 2 g in sodium chloride 0.9 % 100 mL IVPB  Status:  Discontinued     2 g 200 mL/hr over 30 Minutes Intravenous Every 24 hours 02/18/19 0905 02/18/19 0936   02/18/19 0500  vancomycin (VANCOCIN) IVPB 1000 mg/200 mL premix     1,000 mg 200 mL/hr over 60 Minutes Intravenous Every 12 hours 02/17/19 1904     02/17/19 1930  piperacillin-tazobactam (ZOSYN) IVPB 3.375 g  Status:  Discontinued     3.375 g 12.5 mL/hr over 240 Minutes Intravenous Every 8 hours 02/17/19 1821 02/18/19 0905      Subjective: Without complaints  Objective: Vitals:   02/21/19 2245 02/22/19 0746 02/22/19 2319 02/23/19 0745  BP: 122/80 (!) 130/96 126/86 135/85  Pulse: 91 87 95 81  Resp: 17 16  16   Temp: 97.7 F (36.5 C) 98.6 F (37 C) (!) 97.4 F (36.3 C) 98.7 F (37.1 C)  TempSrc: Oral Oral Oral Oral  SpO2: 100% 100% 100% 100%  Weight:      Height:  Intake/Output Summary (Last 24 hours) at 02/23/2019 1526 Last data filed at 02/23/2019 1500 Gross per 24 hour  Intake 3120.38 ml  Output -  Net 3120.38 ml   Filed Weights   02/17/19 1837  Weight: 61.7 kg    Examination: General exam: Awake, laying in bed, in nad Respiratory system: Normal respiratory effort, no wheezing Cardiovascular system: regular rate, s1, s2 Gastrointestinal system: Soft, nondistended, positive BS Central nervous system: CN2-12 grossly intact, strength intact Extremities: Perfused, no clubbing Skin: Normal skin turgor, no notable skin lesions seen Psychiatry: Mood normal // no visual  hallucinations   Data Reviewed: I have personally reviewed following labs and imaging studies  CBC: Recent Labs  Lab 02/17/19 1535 02/18/19 0637 02/19/19 0548  WBC 14.7* 12.0* 10.1  NEUTROABS 11.4*  --   --   HGB 12.4 12.2 11.6*  HCT 38.5 36.7 35.0*  MCV 90.6 87.8 88.2  PLT 205 195 209   Basic Metabolic Panel: Recent Labs  Lab 02/17/19 1535 02/18/19 0637 02/19/19 0548 02/23/19 0910  NA 133* 134* 137  --   K 3.6 3.6 3.8  --   CL 97* 102 106  --   CO2 23 23 23   --   GLUCOSE 87 107* 126*  --   BUN 10 7 12   --   CREATININE 0.65 0.56 0.52 0.74  CALCIUM 8.6* 8.6* 8.5*  --   MG 2.1  --   --   --    GFR: Estimated Creatinine Clearance: 96.7 mL/min (by C-G formula based on SCr of 0.74 mg/dL). Liver Function Tests: Recent Labs  Lab 02/17/19 1535 02/18/19 0637 02/19/19 0548  AST 63* 51* 40  ALT 108* 91* 70*  ALKPHOS 77 76 64  BILITOT 1.9* 1.7* 0.8  PROT 8.1 7.8 7.3  ALBUMIN 3.2* 3.1* 2.7*   No results for input(s): LIPASE, AMYLASE in the last 168 hours. No results for input(s): AMMONIA in the last 168 hours. Coagulation Profile: No results for input(s): INR, PROTIME in the last 168 hours. Cardiac Enzymes: No results for input(s): CKTOTAL, CKMB, CKMBINDEX, TROPONINI in the last 168 hours. BNP (last 3 results) No results for input(s): PROBNP in the last 8760 hours. HbA1C: No results for input(s): HGBA1C in the last 72 hours. CBG: No results for input(s): GLUCAP in the last 168 hours. Lipid Profile: No results for input(s): CHOL, HDL, LDLCALC, TRIG, CHOLHDL, LDLDIRECT in the last 72 hours. Thyroid Function Tests: No results for input(s): TSH, T4TOTAL, FREET4, T3FREE, THYROIDAB in the last 72 hours. Anemia Panel: No results for input(s): VITAMINB12, FOLATE, FERRITIN, TIBC, IRON, RETICCTPCT in the last 72 hours. Sepsis Labs: No results for input(s): PROCALCITON, LATICACIDVEN in the last 168 hours.  Recent Results (from the past 240 hour(s))  Culture, blood  (routine x 2)     Status: None   Collection Time: 02/17/19  5:14 PM   Specimen: BLOOD RIGHT HAND  Result Value Ref Range Status   Specimen Description BLOOD RIGHT HAND  Final   Special Requests   Final    BOTTLES DRAWN AEROBIC AND ANAEROBIC Blood Culture results may not be optimal due to an inadequate volume of blood received in culture bottles   Culture   Final    NO GROWTH 5 DAYS Performed at [facility], [address] [place], [place] [address]    Report Status 02/22/2019 FINAL  Final  Culture, blood (routine x 2)     Status: None   Collection Time: 02/17/19  5:16 PM  Specimen: BLOOD  Result Value Ref Range Status   Specimen Description BLOOD RIGHT THUMB  Final   Special Requests   Final    BOTTLES DRAWN AEROBIC AND ANAEROBIC Blood Culture adequate volume   Culture   Final    NO GROWTH 5 DAYS Performed at [facility], [address] [place], [place] [address]    Report Status 02/22/2019 FINAL  Final  Culture, blood (routine x 2)     Status: None   Collection Time: 02/18/19  6:37 AM   Specimen: BLOOD RIGHT HAND  Result Value Ref Range Status   Specimen Description BLOOD RIGHT HAND  Final   Special Requests   Final    BOTTLES DRAWN AEROBIC ONLY Blood Culture results may not be optimal due to an excessive volume of blood received in culture bottles   Culture   Final    NO GROWTH 5 DAYS Performed at [facility], [address] [place], [place] [address]    Report Status 02/23/2019 FINAL  Final  Culture, blood (routine x 2)     Status: None   Collection Time: 02/18/19  6:47 AM   Specimen: BLOOD  Result Value Ref Range Status   Specimen Description BLOOD RIGHT ANTECUBITAL  Final   Special Requests   Final    BOTTLES DRAWN AEROBIC ONLY Blood Culture results may not be optimal due to an excessive volume of blood received in culture bottles   Culture   Final    NO GROWTH 5 DAYS Performed at [facility], [address] [place], [place] [address]     Report Status 02/23/2019 FINAL  Final     Radiology Studies: No results found.  Scheduled Meds: . cloNIDine  0.1 mg Oral QAC breakfast  . nicotine  14 mg Transdermal Daily   Continuous Infusions: . sodium chloride 100 mL/hr at 02/22/19 1839  . vancomycin 1,000 mg (02/23/19 [id number])     LOS: 6 days   Rickey BarbaraStephen , MD [facility] Pager On Amion  If 7PM-7AM, please contact night-coverage 02/23/2019, 3:26 PM

## 2019-02-23 NOTE — Progress Notes (Signed)
Pharmacy Antibiotic Note  Tracy Fowler is a 23 y.o. female admitted on 02/17/2019 with r/o endocarditis.  Pharmacy has been consulted for vancomycin.   SCr 0.74, Vancomycin peak yesterday of 28, and VT this AM of 7 with a therapeutic AUC of 454 calculated.    Plan: Continue vanc 1gm IV Q12H Monitor renal function, ID recs/LOT, TEE Vancomycin levels as needed  Height: 5\' 5"  (165.1 cm) Weight: 136 lb 0.4 oz (61.7 kg) IBW/kg (Calculated) : 57  Temp (24hrs), Avg:98.1 F (36.7 C), Min:97.4 F (36.3 C), Max:98.7 F (37.1 C)  Recent Labs  Lab 02/17/19 1535 02/18/19 0637 02/19/19 0548 02/19/19 1950 02/20/19 0722 02/22/19 2105 02/23/19 0910  WBC 14.7* 12.0* 10.1  --   --   --   --   CREATININE 0.65 0.56 0.52  --   --   --  0.74  VANCOTROUGH  --   --   --   --  4*  --  7*  VANCOPEAK  --   --   --  34  --  28*  --     Estimated Creatinine Clearance: 96.7 mL/min (by C-G formula based on SCr of 0.74 mg/dL).    Allergies  Allergen Reactions  . Prednisone Swelling    Bertis Ruddy, PharmD Clinical Pharmacist Please check AMION for all [place] numbers 02/23/2019 10:30 AM

## 2019-02-23 NOTE — Progress Notes (Signed)
Report from [facility] has been faxed over that reads positive anaerobic blood culture which was collected on 6/16; positive for gram positive cocci in clusters, MRSA, and staph aureus.  These results have been communicated to the on call provider and filed in patient's chart.

## 2019-02-24 LAB — MRSA PCR SCREENING: MRSA by PCR: POSITIVE — AB

## 2019-02-24 MED ORDER — SODIUM CHLORIDE 0.9 % IV SOLN: INTRAVENOUS | Status: DC

## 2019-02-24 NOTE — Progress Notes (Signed)
Chart reviewed for LOS B Kevontay Burks RN,MHA,BSN Advance Care Supervisor [phone] 

## 2019-02-24 NOTE — Progress Notes (Signed)
Pt asking to leave AMA because a friend of hers was found dead. NP to bedside. Pt is alert and oriented and is aware of the risks of leaving. We discussed these risks, including, but not limited to: deterioration of health, returning to hospital, starting over course of treatment, open heart surgery due to valve damage, and death.  She accepts these risks, signs out AMA and leaves.  KJKG, NP [facility]

## 2019-02-24 NOTE — Progress Notes (Addendum)
    [facility] has been requested to perform a transesophageal echocardiogram on 02/25/19 for Bacteremia.  After careful review of history and examination, the risks and benefits of transesophageal echocardiogram have been explained including risks of esophageal damage, perforation (1:10,000 risk), bleeding, pharyngeal hematoma as well as other potential complications associated with conscious sedation including aspiration, arrhythmia, respiratory failure and death. Alternatives to treatment were discussed, questions were answered. Patient is willing to proceed. Labs and vital signs stable. Notes from ID indicate she was COVID negative from [place].  Reino Bellis, NP-C 02/24/2019 4:09 PM

## 2019-02-24 NOTE — Progress Notes (Addendum)
Pt left AMA. Kirby-Graham, NP on-call & Charge RN aware. Education was provided at the bedside by on-call provider & pt left stating that she had a ride waiting for her. Right forearm PIV was removed prior to discharge.

## 2019-02-24 NOTE — Progress Notes (Signed)
Patient reports feeling better this morning, still c/o some right sided rib pain that radiates up to neck at times. Administered PRN pain medication which relieved pain. Patient has been up and showered and is currently ambulating around the unit. Vitals have been stable. Patient is pending TEE 6/24. Will continue to monitor.

## 2019-02-24 NOTE — Progress Notes (Signed)
    [facility] for Infectious Disease    Date of Admission:  02/17/2019   Total days of antibiotics 8           ID: Tracy Fowler is a 23 y.o. female with  mrsa bacteremia with septic pulmonary emboli with presumed endocarditis Principal Problem:   MRSA bacteremia Active Problems:   Septic embolism ([place])   Injection of illicit drug within last 12 months   Transaminitis   Opioid dependence with withdrawal ([place])    Subjective: Afebrile though has subjective chills. Pleuritic chest pain improved.  Medications:  . nicotine  14 mg Transdermal Daily    Objective: Vital signs in last 24 hours: Temp:  [98.1 F (36.7 C)-98.9 F (37.2 C)] 98.9 F (37.2 C) (06/23 1641) Pulse Rate:  [84-100] 100 (06/23 1641) Resp:  [16-18] 16 (06/23 0803) BP: (118-128)/(66-76) 128/76 (06/23 1641) SpO2:  [99 %-100 %] 99 % (06/23 1641) Physical Exam  Constitutional:  oriented to person, place, and time. appears well-developed and well-nourished. No distress.  HENT: [place]/AT, PERRLA, no scleral icterus Mouth/Throat: Oropharynx is clear and moist. No oropharyngeal exudate.  Cardiovascular: Normal rate, regular rhythm and normal heart sounds. Exam reveals no gallop and no friction rub.  No murmur heard.  Pulmonary/Chest: Effort normal and breath sounds normal. No respiratory distress.  has no wheezes.  Lymphadenopathy: no cervical adenopathy. No axillary adenopathy Skin: Skin is warm and dry. No rash noted. No erythema.  Psychiatric: a normal mood and affect.  behavior is normal.    Lab Results Recent Labs    02/23/19 0910  CREATININE 0.74    Microbiology: reviewed Studies/Results: No results found.   Assessment/Plan: Complicated MRSA bacteremia with pulmonary septic emboli, presumed endocarditis - continue with vancomycin, and follow renal function Q5day - awaiting TEE scheduled for tomorrow to decide on length of therapy  Opiate withdrawal syndrome =appears improved  [facility] for Infectious Diseases Cell: [phone] Pager: [phone]  02/24/2019, 5:21 PM

## 2019-02-24 NOTE — Progress Notes (Signed)
Called to patients room around 2020 patient informed that she wanted to leave AMA to attend a friends funeral, then return for treatment.  Patient educated on AMA process and encouraged to remain for treatment.  She informed that her transport was in route from [place] to pick her up.  Hospitalist Baltazar Najjar paged to come and speak with patient.  Hospitalist spoke with patient, AMA paperwork was complete and PIV removed.  Patient informed  to resume care she would have to return to the facilities emergency department.  Patient verbalized understanding. Discharged at 2135, ambulated from unit.

## 2019-02-24 NOTE — Progress Notes (Signed)
PROGRESS NOTE    Tracy Fowler  ZOX:[id number] DOB: 1996/07/09 DOA: 02/17/2019 PCP: Patient, No Pcp Per    Brief Narrative:  23 y.o. female with past medical history significant for asthma and ongoing IVDU who last shot up last week was in her usual state of relatively poor health until 3 days ago when she noted onset of rib pain.  Patient initially thought that she had "pulled a rib muscle" however pain increased significantly and it became difficult to breathe secondary to pain.  Patient did have some associated shortness of breath but is unable to quantify exactly how much.  She also noted that she did have a cough however it was nonproductive.  Patient noted the cough was worsened when she was crying from the pain.  Patient admits to feeling hot and having some chills and sweats.  No chest pain.  No new lower extremity swelling.  No nausea vomiting diarrhea.  No hemoptysis.  Assessment & Plan:   Principal Problem:   MRSA bacteremia Active Problems:   Septic embolism ([facility])   Injection of illicit drug within last 12 months   Transaminitis   Opioid dependence with withdrawal ([facility])  1. MRSA bacteremia with embolic pulm emboli with sepsis present on admit 1. Septic emboli noted on CT chest 2. Blood cx pos for MRSA 3. ID recs noted with recommendation for TEE. TTE performed and results reviewed. Findings worrisome for mild thickening of the mitral valve and increased thickness on the R ventricular surface of the tricuspid valve leaflets. Per Cardiology, recommendation for f/u TEE.  4. Have spoken with Cardiology on 6/19 regarding need for TEE, pt is tentatively planned for TEE on 6/24. Cardiology to see 5. Patient is continued on vanc per ID recs 6. Currently stable 2. Asthma 1. No wheezing on exam 2. Remains on minimal O2 support 3. Presently stable 3. IV drug abuse 1. Cessation was done at bedside 2. Pt is continued on clonidine protocol 3. Stable at this time. Patient seems  very motivated to quit her addiction, congratulated 4. Had consulted CM to provide drug resource programs  DVT prophylaxis: SCD's Code Status: Full Family Communication: Pt in room, family not at bedside Disposition Plan: Uncertain at this time  Consultants:   ID  Cardiology  Procedures:     Antimicrobials: Anti-infectives (From admission, onward)   Start     Dose/Rate Route Frequency Ordered Stop   02/18/19 1130  cefTRIAXone (ROCEPHIN) 2 g in sodium chloride 0.9 % 100 mL IVPB  Status:  Discontinued     2 g 200 mL/hr over 30 Minutes Intravenous Every 24 hours 02/18/19 0905 02/18/19 0936   02/18/19 0500  vancomycin (VANCOCIN) IVPB 1000 mg/200 mL premix     1,000 mg 200 mL/hr over 60 Minutes Intravenous Every 12 hours 02/17/19 1904     02/17/19 1930  piperacillin-tazobactam (ZOSYN) IVPB 3.375 g  Status:  Discontinued     3.375 g 12.5 mL/hr over 240 Minutes Intravenous Every 8 hours 02/17/19 1821 02/18/19 0905      Subjective: No complaints this AM  Objective: Vitals:   02/23/19 1559 02/23/19 2332 02/24/19 0750 02/24/19 0803  BP: 122/82 127/71 118/66   Pulse: 99 99 84   Resp: 16 18  16   Temp: 97.9 F (36.6 C) 98.1 F (36.7 C) 98.4 F (36.9 C)   TempSrc: Oral  Oral   SpO2: 100% 100% 100%   Weight:      Height:  Intake/Output Summary (Last 24 hours) at 02/24/2019 1331 Last data filed at 02/23/2019 1500 Gross per 24 hour  Intake 2020.38 ml  Output -  Net 2020.38 ml   Filed Weights   02/17/19 1837  Weight: 61.7 kg    Examination: General exam: Conversant, in no acute distress Respiratory system: normal chest rise, clear, no audible wheezing Cardiovascular system: regular rhythm, s1-s2 Gastrointestinal system: Nondistended, nontender, pos BS Central nervous system: No seizures, no tremors Extremities: No cyanosis, no joint deformities Skin: No rashes, no pallor Psychiatry: Affect normal // no auditory hallucinations   Data Reviewed: I have  personally reviewed following labs and imaging studies  CBC: Recent Labs  Lab 02/17/19 1535 02/18/19 0637 02/19/19 0548  WBC 14.7* 12.0* 10.1  NEUTROABS 11.4*  --   --   HGB 12.4 12.2 11.6*  HCT 38.5 36.7 35.0*  MCV 90.6 87.8 88.2  PLT 205 195 979   Basic Metabolic Panel: Recent Labs  Lab 02/17/19 1535 02/18/19 0637 02/19/19 0548 02/23/19 0910  NA 133* 134* 137  --   K 3.6 3.6 3.8  --   CL 97* 102 106  --   CO2 23 23 23   --   GLUCOSE 87 107* 126*  --   BUN 10 7 12   --   CREATININE 0.65 0.56 0.52 0.74  CALCIUM 8.6* 8.6* 8.5*  --   MG 2.1  --   --   --    GFR: Estimated Creatinine Clearance: 96.7 mL/min (by C-G formula based on SCr of 0.74 mg/dL). Liver Function Tests: Recent Labs  Lab 02/17/19 1535 02/18/19 0637 02/19/19 0548  AST 63* 51* 40  ALT 108* 91* 70*  ALKPHOS 77 76 64  BILITOT 1.9* 1.7* 0.8  PROT 8.1 7.8 7.3  ALBUMIN 3.2* 3.1* 2.7*   No results for input(s): LIPASE, AMYLASE in the last 168 hours. No results for input(s): AMMONIA in the last 168 hours. Coagulation Profile: No results for input(s): INR, PROTIME in the last 168 hours. Cardiac Enzymes: No results for input(s): CKTOTAL, CKMB, CKMBINDEX, TROPONINI in the last 168 hours. BNP (last 3 results) No results for input(s): PROBNP in the last 8760 hours. HbA1C: No results for input(s): HGBA1C in the last 72 hours. CBG: Recent Labs  Lab 02/23/19 2104  GLUCAP 96   Lipid Profile: No results for input(s): CHOL, HDL, LDLCALC, TRIG, CHOLHDL, LDLDIRECT in the last 72 hours. Thyroid Function Tests: No results for input(s): TSH, T4TOTAL, FREET4, T3FREE, THYROIDAB in the last 72 hours. Anemia Panel: No results for input(s): VITAMINB12, FOLATE, FERRITIN, TIBC, IRON, RETICCTPCT in the last 72 hours. Sepsis Labs: No results for input(s): PROCALCITON, LATICACIDVEN in the last 168 hours.  Recent Results (from the past 240 hour(s))  Culture, blood (routine x 2)     Status: None   Collection Time:  02/17/19  5:14 PM   Specimen: BLOOD RIGHT HAND  Result Value Ref Range Status   Specimen Description BLOOD RIGHT HAND  Final   Special Requests   Final    BOTTLES DRAWN AEROBIC AND ANAEROBIC Blood Culture results may not be optimal due to an inadequate volume of blood received in culture bottles   Culture   Final    NO GROWTH 5 DAYS Performed at [facility], [place] [address] [place], [place] [address]    Report Status 02/22/2019 FINAL  Final  Culture, blood (routine x 2)     Status: None   Collection Time: 02/17/19  5:16 PM  Specimen: BLOOD  Result Value Ref Range Status   Specimen Description BLOOD RIGHT THUMB  Final   Special Requests   Final    BOTTLES DRAWN AEROBIC AND ANAEROBIC Blood Culture adequate volume   Culture   Final    NO GROWTH 5 DAYS Performed at [facility], [address] [place], [place] [address]    Report Status 02/22/2019 FINAL  Final  Culture, blood (routine x 2)     Status: None   Collection Time: 02/18/19  6:37 AM   Specimen: BLOOD RIGHT HAND  Result Value Ref Range Status   Specimen Description BLOOD RIGHT HAND  Final   Special Requests   Final    BOTTLES DRAWN AEROBIC ONLY Blood Culture results may not be optimal due to an excessive volume of blood received in culture bottles   Culture   Final    NO GROWTH 5 DAYS Performed at [facility], [address] [place], [place] [address]    Report Status 02/23/2019 FINAL  Final  Culture, blood (routine x 2)     Status: None   Collection Time: 02/18/19  6:47 AM   Specimen: BLOOD  Result Value Ref Range Status   Specimen Description BLOOD RIGHT ANTECUBITAL  Final   Special Requests   Final    BOTTLES DRAWN AEROBIC ONLY Blood Culture results may not be optimal due to an excessive volume of blood received in culture bottles   Culture   Final    NO GROWTH 5 DAYS Performed at [facility], [address] [place], [place] [address]    Report Status 02/23/2019 FINAL  Final  MRSA  PCR Screening     Status: Abnormal   Collection Time: 02/24/19  9:04 AM   Specimen: Nasal Mucosa; Nasopharyngeal  Result Value Ref Range Status   MRSA by PCR POSITIVE (A) NEGATIVE Final    Comment:        The GeneXpert MRSA Assay (FDA approved for NASAL specimens only), is one component of a comprehensive MRSA colonization surveillance program. It is not intended to diagnose MRSA infection nor to guide or monitor treatment for MRSA infections. RESULT CALLED TO, READ BACK BY AND VERIFIED WITH: Chana BodeK. Pace RN 11:45 02/24/19 ([email or web address]) Performed at [facility], [address] [place], [place] [address]      Radiology Studies: No results found.  Scheduled Meds: . nicotine  14 mg Transdermal Daily   Continuous Infusions: . vancomycin 1,000 mg (02/24/19 0555)     LOS: 7 days   Rickey BarbaraStephen Acen Craun, MD [facility] Pager On Amion  If 7PM-7AM, please contact night-coverage 02/24/2019, 1:31 PM

## 2019-02-25 SURGERY — ECHOCARDIOGRAM, TRANSESOPHAGEAL
Anesthesia: Monitor Anesthesia Care

## 2019-03-04 NOTE — Discharge Summary (Signed)
Physician Discharge Summary  Tracy Fowler ZOX:[id number] DOB: 1995-11-08 DOA: 02/17/2019  PCP: Patient, No Pcp Per  Admit date: 02/17/2019 Discharge date: 02/24/2019  Admitted From: Home Disposition:  Patient left AMA   Brief/Interim Summary: 23 y.o.femalewith past medical history significant for asthma and ongoing IVDU who last shot up last week was in her usual state of relatively poor health until 3 days ago when she noted onset of rib pain. Patient initially thought that she had "pulled a rib muscle" however pain increased significantly and it became difficult to breathe secondary to pain. Patient did have some associated shortness of breath but is unable to quantify exactly how much. She also noted that she did have a cough however it was nonproductive. Patient noted the cough was worsened when she was crying from the pain.  Patient admits to feeling hot and having some chills and sweats. No chest pain. No new lower extremity swelling. No nausea vomiting diarrhea. No hemoptysis  Discharge Diagnoses:  Principal Problem:   MRSA bacteremia Active Problems:   Septic embolism (HCC)   Injection of illicit drug within last 12 months   Transaminitis   Opioid dependence with withdrawal (HCC)  1. MRSA bacteremia with embolic pulm emboli with sepsis present on admit 1. Septic emboli noted on CT chest 2. Blood cx pos for MRSA 3. ID recs noted with recommendation for TEE. TTE performed and results reviewed. Findings worrisome for mild thickening of the mitral valve and increased thickness on the R ventricular surface of the tricuspid valve leaflets. Per Cardiology, recommendation for f/u TEE.  4. Have spoken with Cardiology on 6/19 regarding need for TEE, pt is tentatively planned for TEE on 6/24. Cardiology to see 5. Patient was continued on vanc per ID recs 6. On the evening of 6/23, patient elected to leave against medical advice 2. Asthma 1. Was without wheezing on  exam 2. Remained on minimal O2 support 3. IV drug abuse 1. Cessation was done at bedside 2. Pt is continued on clonidine protocol 3. Stable at this time. 4. Had consulted CM to provide drug resource programs   Discharge Instructions   Allergies as of 02/24/2019      Reactions   Prednisone Swelling      Medication List    You have not been prescribed any medications.     Allergies  Allergen Reactions  . Prednisone Swelling    Consultations:  ID  Procedures/Studies: Vas Koreas Lower Extremity Venous (dvt)  Result Date: 02/19/2019  Lower Venous Study Indications: Swelling, and Pain.  Performing Technologist: Blanch MediaMegan Riddle RVS  Examination Guidelines: A complete evaluation includes B-mode imaging, spectral Doppler, color Doppler, and power Doppler as needed of all accessible portions of each vessel. Bilateral testing is considered an integral part of a complete examination. Limited examinations for reoccurring indications may be performed as noted.  +---------+---------------+---------+-----------+----------+--------------+ RIGHT    CompressibilityPhasicitySpontaneityPropertiesSummary        +---------+---------------+---------+-----------+----------+--------------+ CFV      Full           Yes      Yes                                 +---------+---------------+---------+-----------+----------+--------------+ SFJ      Full                                                        +---------+---------------+---------+-----------+----------+--------------+  FV Prox  Full                                                        +---------+---------------+---------+-----------+----------+--------------+ FV Mid   Full                                                        +---------+---------------+---------+-----------+----------+--------------+ FV DistalFull                                                         +---------+---------------+---------+-----------+----------+--------------+ PFV      Full                                                        +---------+---------------+---------+-----------+----------+--------------+ POP      Full           Yes      Yes                                 +---------+---------------+---------+-----------+----------+--------------+ PTV      Full                                                        +---------+---------------+---------+-----------+----------+--------------+ PERO                                                  Not visualized +---------+---------------+---------+-----------+----------+--------------+   +---------+---------------+---------+-----------+----------+-------+ LEFT     CompressibilityPhasicitySpontaneityPropertiesSummary +---------+---------------+---------+-----------+----------+-------+ CFV      Full           Yes      Yes                          +---------+---------------+---------+-----------+----------+-------+ SFJ      Full                                                 +---------+---------------+---------+-----------+----------+-------+ FV Prox  Full                                                 +---------+---------------+---------+-----------+----------+-------+ FV Mid   Full                                                 +---------+---------------+---------+-----------+----------+-------+  FV DistalFull                                                 +---------+---------------+---------+-----------+----------+-------+ PFV      Full                                                 +---------+---------------+---------+-----------+----------+-------+ POP      Full           Yes      Yes                          +---------+---------------+---------+-----------+----------+-------+ PTV      Full                                                  +---------+---------------+---------+-----------+----------+-------+ PERO     Full                                                 +---------+---------------+---------+-----------+----------+-------+     Summary: Right: There is no evidence of deep vein thrombosis in the lower extremity. No cystic structure found in the popliteal fossa. Left: There is no evidence of deep vein thrombosis in the lower extremity. No cystic structure found in the popliteal fossa.  *See table(s) above for measurements and observations. Electronically signed by Servando Snare MD on 02/19/2019 at 4:18:32 PM.    Final       Discharge Exam: Vitals:   02/24/19 0803 02/24/19 1641  BP:  128/76  Pulse:  100  Resp: 16   Temp:  98.9 F (37.2 C)  SpO2:  99%   Vitals:   02/23/19 2332 02/24/19 0750 02/24/19 0803 02/24/19 1641  BP: 127/71 118/66  128/76  Pulse: 99 84  100  Resp: 18  16   Temp: 98.1 F (36.7 C) 98.4 F (36.9 C)  98.9 F (37.2 C)  TempSrc:  Oral  Oral  SpO2: 100% 100%  99%  Weight:      Height:         The results of significant diagnostics from this hospitalization (including imaging, microbiology, ancillary and laboratory) are listed below for reference.     Microbiology: Recent Results (from the past 240 hour(s))  MRSA PCR Screening     Status: Abnormal   Collection Time: 02/24/19  9:04 AM   Specimen: Nasal Mucosa; Nasopharyngeal  Result Value Ref Range Status   MRSA by PCR POSITIVE (A) NEGATIVE Final    Comment:        The GeneXpert MRSA Assay (FDA approved for NASAL specimens only), is one component of a comprehensive MRSA colonization surveillance program. It is not intended to diagnose MRSA infection nor to guide or monitor treatment for MRSA infections. RESULT CALLED TO, READ BACK BY AND VERIFIED WITH: Letta Median RN 11:45 02/24/19 ([email or web address]) Performed at [facility], [place] [address], [place], [place] [address]  Labs: BNP (last 3 results) No results for input(s):  BNP in the last 8760 hours. Basic Metabolic Panel: No results for input(s): NA, K, CL, CO2, GLUCOSE, BUN, CREATININE, CALCIUM, MG, PHOS in the last 168 hours. Liver Function Tests: No results for input(s): AST, ALT, ALKPHOS, BILITOT, PROT, ALBUMIN in the last 168 hours. No results for input(s): LIPASE, AMYLASE in the last 168 hours. No results for input(s): AMMONIA in the last 168 hours. CBC: No results for input(s): WBC, NEUTROABS, HGB, HCT, MCV, PLT in the last 168 hours. Cardiac Enzymes: No results for input(s): CKTOTAL, CKMB, CKMBINDEX, TROPONINI in the last 168 hours. BNP: Invalid input(s): POCBNP CBG: No results for input(s): GLUCAP in the last 168 hours. D-Dimer No results for input(s): DDIMER in the last 72 hours. Hgb A1c No results for input(s): HGBA1C in the last 72 hours. Lipid Profile No results for input(s): CHOL, HDL, LDLCALC, TRIG, CHOLHDL, LDLDIRECT in the last 72 hours. Thyroid function studies No results for input(s): TSH, T4TOTAL, T3FREE, THYROIDAB in the last 72 hours.  Invalid input(s): FREET3 Anemia work up No results for input(s): VITAMINB12, FOLATE, FERRITIN, TIBC, IRON, RETICCTPCT in the last 72 hours. Urinalysis No results found for: COLORURINE, APPEARANCEUR, LABSPEC, PHURINE, GLUCOSEU, HGBUR, BILIRUBINUR, KETONESUR, PROTEINUR, UROBILINOGEN, NITRITE, LEUKOCYTESUR Sepsis Labs Invalid input(s): PROCALCITONIN,  WBC,  LACTICIDVEN Microbiology Recent Results (from the past 240 hour(s))  MRSA PCR Screening     Status: Abnormal   Collection Time: 02/24/19  9:04 AM   Specimen: Nasal Mucosa; Nasopharyngeal  Result Value Ref Range Status   MRSA by PCR POSITIVE (A) NEGATIVE Final    Comment:        The GeneXpert MRSA Assay (FDA approved for NASAL specimens only), is one component of a comprehensive MRSA colonization surveillance program. It is not intended to diagnose MRSA infection nor to guide or monitor treatment for MRSA infections. RESULT CALLED TO,  READ BACK BY AND VERIFIED WITH: Chana BodeK. Pace RN 11:45 02/24/19 ([email or web address]) Performed at [facility], [address] [place], [place] [address]      SIGNED:   Rickey BarbaraStephen Chiu, MD  [facility] 03/04/2019, 6:58 PM  If 7PM-7AM, please contact night-coverage

## 2019-10-21 ENCOUNTER — Emergency Department (HOSPITAL_COMMUNITY)
Admission: EM | Admit: 2019-10-21 | Discharge: 2019-10-21 | Disposition: A | Discharge status: Home / Self Care | Payer: Self-pay | Attending: Emergency Medicine | Admitting: Emergency Medicine

## 2019-10-21 ENCOUNTER — Other Ambulatory Visit: Payer: Self-pay

## 2019-10-21 ENCOUNTER — Emergency Department (HOSPITAL_COMMUNITY): Payer: Self-pay

## 2019-10-21 DIAGNOSIS — R748 Abnormal levels of other serum enzymes: Secondary | ICD-10-CM

## 2019-10-21 DIAGNOSIS — T39391A Poisoning by other nonsteroidal anti-inflammatory drugs [NSAID], accidental (unintentional), initial encounter: Secondary | ICD-10-CM

## 2019-10-21 DIAGNOSIS — K029 Dental caries, unspecified: Secondary | ICD-10-CM | POA: Insufficient documentation

## 2019-10-21 DIAGNOSIS — R42 Dizziness and giddiness: Secondary | ICD-10-CM | POA: Insufficient documentation

## 2019-10-21 DIAGNOSIS — R220 Localized swelling, mass and lump, head: Secondary | ICD-10-CM | POA: Insufficient documentation

## 2019-10-21 DIAGNOSIS — F17200 Nicotine dependence, unspecified, uncomplicated: Secondary | ICD-10-CM | POA: Insufficient documentation

## 2019-10-21 DIAGNOSIS — I339 Acute and subacute endocarditis, unspecified: Secondary | ICD-10-CM | POA: Insufficient documentation

## 2019-10-21 DIAGNOSIS — R112 Nausea with vomiting, unspecified: Secondary | ICD-10-CM | POA: Insufficient documentation

## 2019-10-21 DIAGNOSIS — F1910 Other psychoactive substance abuse, uncomplicated: Secondary | ICD-10-CM | POA: Insufficient documentation

## 2019-10-21 DIAGNOSIS — R519 Headache, unspecified: Secondary | ICD-10-CM | POA: Insufficient documentation

## 2019-10-21 LAB — COMPREHENSIVE METABOLIC PANEL
ALT: 323 U/L — ABNORMAL HIGH (ref 0–44)
ALT: 279 U/L — ABNORMAL HIGH (ref 0–44)
AST: 158 U/L — ABNORMAL HIGH (ref 15–41)
AST: 134 U/L — ABNORMAL HIGH (ref 15–41)
Albumin: 3.3 g/dL — ABNORMAL LOW (ref 3.5–5.0)
Albumin: 3 g/dL — ABNORMAL LOW (ref 3.5–5.0)
Alkaline Phosphatase: 47 U/L (ref 38–126)
Alkaline Phosphatase: 42 U/L (ref 38–126)
Anion gap: 12 (ref 5–15)
Anion gap: 7 (ref 5–15)
BUN: 18 mg/dL (ref 6–20)
BUN: 18 mg/dL (ref 6–20)
CO2: 19 mmol/L — ABNORMAL LOW (ref 22–32)
CO2: 20 mmol/L — ABNORMAL LOW (ref 22–32)
Calcium: 8.1 mg/dL — ABNORMAL LOW (ref 8.9–10.3)
Calcium: 7.8 mg/dL — ABNORMAL LOW (ref 8.9–10.3)
Chloride: 107 mmol/L (ref 98–111)
Chloride: 109 mmol/L (ref 98–111)
Creatinine, Ser: 0.93 mg/dL (ref 0.44–1.00)
Creatinine, Ser: 0.83 mg/dL (ref 0.44–1.00)
GFR calc Af Amer: >60 mL/min (ref >60)
GFR calc Af Amer: >60 mL/min (ref >60)
GFR calc non Af Amer: >60 mL/min (ref >60)
GFR calc non Af Amer: >60 mL/min (ref >60)
Glucose, Bld: 90 mg/dL (ref 70–99)
Glucose, Bld: 107 mg/dL — ABNORMAL HIGH (ref 70–99)
Potassium: 3.9 mmol/L (ref 3.5–5.1)
Potassium: 3.8 mmol/L (ref 3.5–5.1)
Sodium: 138 mmol/L (ref 135–145)
Sodium: 136 mmol/L (ref 135–145)
Total Bilirubin: 0.6 mg/dL (ref 0.3–1.2)
Total Bilirubin: 0.8 mg/dL (ref 0.3–1.2)
Total Protein: 6.7 g/dL (ref 6.5–8.1)
Total Protein: 6 g/dL — ABNORMAL LOW (ref 6.5–8.1)

## 2019-10-21 LAB — POCT I-STAT EG7
Acid-Base Excess: 1 mmol/L (ref 0.0–2.0)
Bicarbonate: 23.5 mmol/L (ref 20.0–28.0)
Calcium, Ion: 0.89 mmol/L — CL (ref 1.15–1.40)
HCT: 34 % — ABNORMAL LOW (ref 36.0–46.0)
Hemoglobin: 11.6 g/dL — ABNORMAL LOW (ref 12.0–15.0)
O2 Saturation: 100 %
Potassium: 3.9 mmol/L (ref 3.5–5.1)
Sodium: 138 mmol/L (ref 135–145)
TCO2: 24 mmol/L (ref 22–32)
pCO2, Ven: 29.1 mmHg — ABNORMAL LOW (ref 44.0–60.0)
pH, Ven: 7.515 — ABNORMAL HIGH (ref 7.250–7.430)
pO2, Ven: 212 mmHg — ABNORMAL HIGH (ref 32.0–45.0)

## 2019-10-21 LAB — CBC WITH DIFFERENTIAL/PLATELET
Abs Immature Granulocytes: 0.01 10*3/uL (ref 0.00–0.07)
Basophils Absolute: 0 10*3/uL (ref 0.0–0.1)
Basophils Relative: 0 %
Eosinophils Absolute: 0.2 10*3/uL (ref 0.0–0.5)
Eosinophils Relative: 4 %
HCT: 38.3 % (ref 36.0–46.0)
Hemoglobin: 12.5 g/dL (ref 12.0–15.0)
Immature Granulocytes: 0 %
Lymphocytes Relative: 45 %
Lymphs Abs: 2.1 10*3/uL (ref 0.7–4.0)
MCH: 29.6 pg (ref 26.0–34.0)
MCHC: 32.6 g/dL (ref 30.0–36.0)
MCV: 90.5 fL (ref 80.0–100.0)
Monocytes Absolute: 0.3 10*3/uL (ref 0.1–1.0)
Monocytes Relative: 7 %
Neutro Abs: 2.1 10*3/uL (ref 1.7–7.7)
Neutrophils Relative %: 44 %
Platelets: 179 10*3/uL (ref 150–400)
RBC: 4.23 MIL/uL (ref 3.87–5.11)
RDW: 14.1 % (ref 11.5–15.5)
WBC: 4.7 10*3/uL (ref 4.0–10.5)
nRBC: 0 % (ref 0.0–0.2)

## 2019-10-21 LAB — ACETAMINOPHEN LEVEL
Acetaminophen (Tylenol), Serum: <10 ug/mL — ABNORMAL LOW (ref 10–30)
Acetaminophen (Tylenol), Serum: <10 ug/mL — ABNORMAL LOW (ref 10–30)

## 2019-10-21 LAB — RAPID URINE DRUG SCREEN, HOSP PERFORMED
Amphetamines: NOT DETECTED
Barbiturates: NOT DETECTED
Benzodiazepines: NOT DETECTED
Cocaine: NOT DETECTED
Opiates: NOT DETECTED
Tetrahydrocannabinol: NOT DETECTED

## 2019-10-21 LAB — MAGNESIUM: Magnesium: 1.7 mg/dL (ref 1.7–2.4)

## 2019-10-21 LAB — I-STAT BETA HCG BLOOD, ED (MC, WL, AP ONLY): I-stat hCG, quantitative: <5 m[IU]/mL (ref <5)

## 2019-10-21 LAB — SALICYLATE LEVEL
Salicylate Lvl: 16.1 mg/dL (ref 7.0–30.0)
Salicylate Lvl: 14.3 mg/dL (ref 7.0–30.0)

## 2019-10-21 LAB — LACTIC ACID, PLASMA
Lactic Acid, Venous: 0.7 mmol/L (ref 0.5–1.9)
Lactic Acid, Venous: 1.1 mmol/L (ref 0.5–1.9)

## 2019-10-21 IMAGING — MR MR HEAD WO/W CM
11 of 19 series · 14 of 48 positions shown · IV contrast (6 GAD)
Comparison: None.

CLINICAL DATA: 23-year-old female with persistent left side
headache. History of IV drug use and partially treated endocarditis.

EXAM:
MRI HEAD WITHOUT AND WITH CONTRAST
MRA HEAD WITHOUT CONTRAST
MRV HEAD WITHOUT AND WITH CONTRAST
TECHNIQUE: Multiplanar, multiecho pulse sequences of the brain and surrounding
structures were obtained without and with intravenous contrast.
Angiographic images of the Circle of Willis were obtained using MRA
technique without intravenous contrast.
Venographic images of the head were obtained using MRV technique
without and with intravenous contrast.
CONTRAST:  7mL GADAVIST GADOBUTROL 1 MMOL/ML IV SOLN

[Series 2: DWI · axial · 3.0mm · 0.94mm/px · z∈[-117,+29]mm · 2 of 100 slices shown (1 of 2)]
[im 1/100]
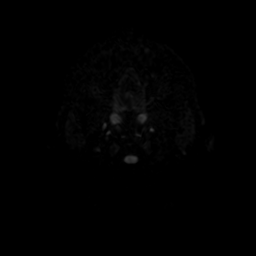
[im 100/100]
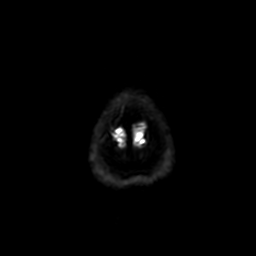

[Series 3: ax (id) · axial · 1.0mm · 0.43mm/px · z∈[-111,-80]mm · 2 of 184 slices shown]
[im 1/184]
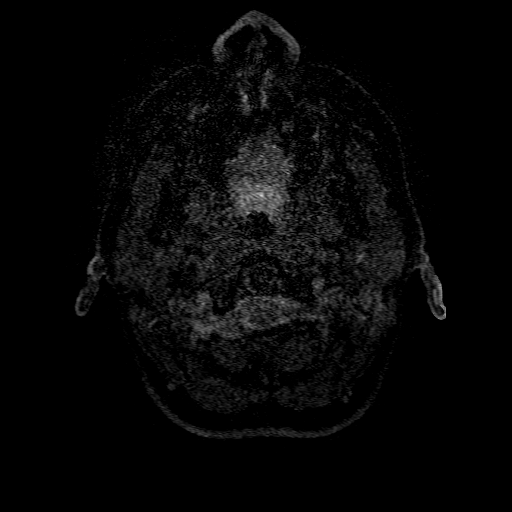
[im 62/184]
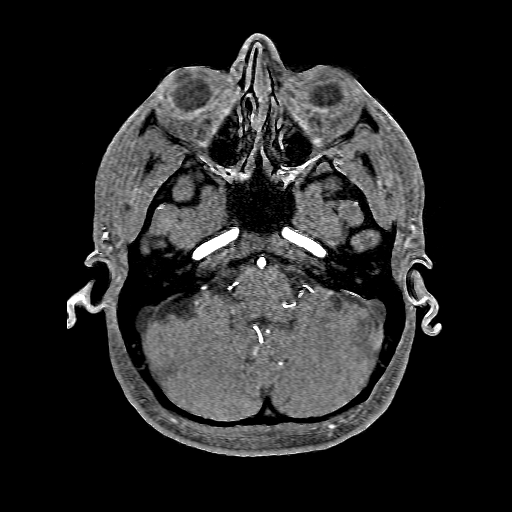

[Series 4: DWI · coronal · 4.0mm · 0.94mm/px · 2 of 74 slices shown (2 of 2)]
[im 1/74]
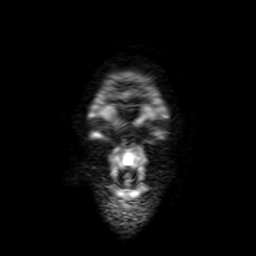
[im 74/74]
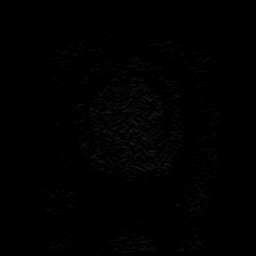

[Series 5: FLAIR · sagittal · 5.0mm · 0.23mm/px · 1 of 23 slices shown (1 of 2)]
[im 1/23]
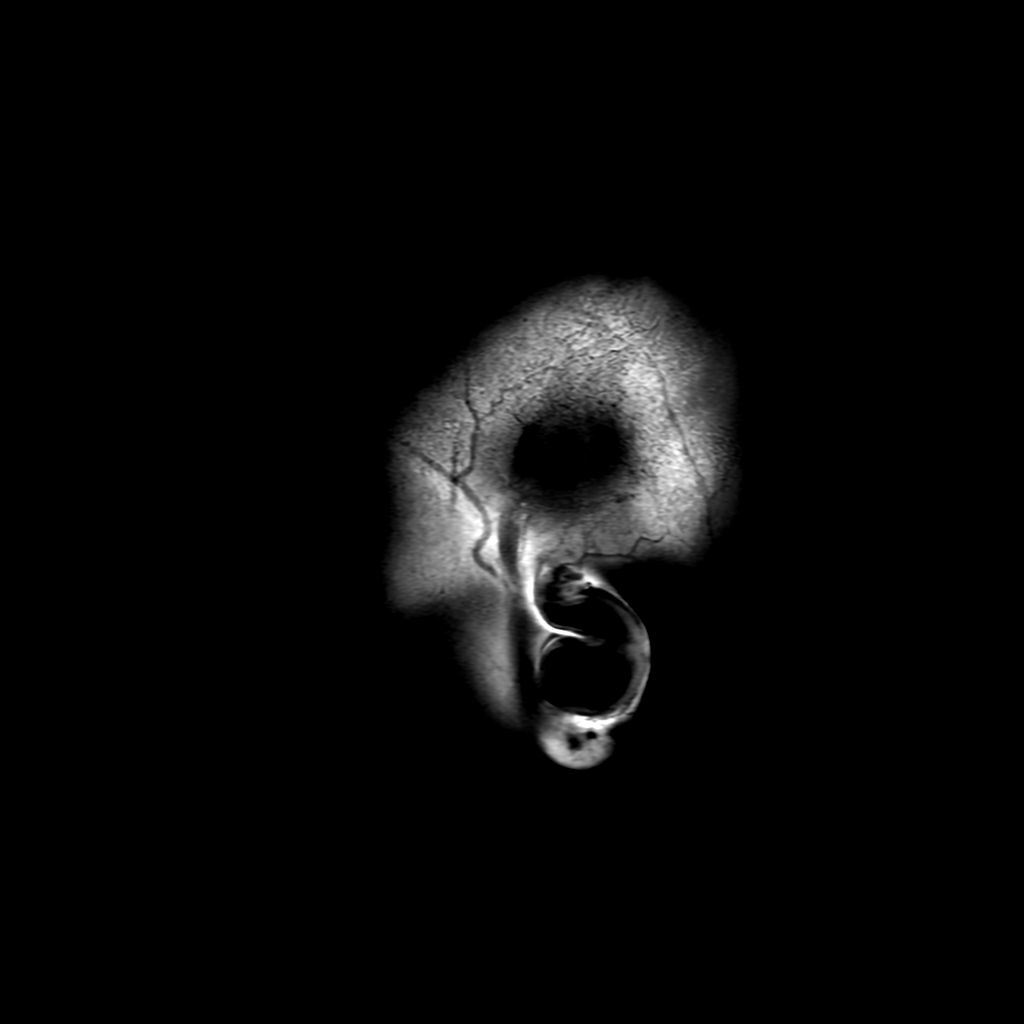

[Series 6: T2 · axial · 5.0mm · 0.23mm/px · 1 of 26 slices shown (1 of 2)]
[im 1/26]
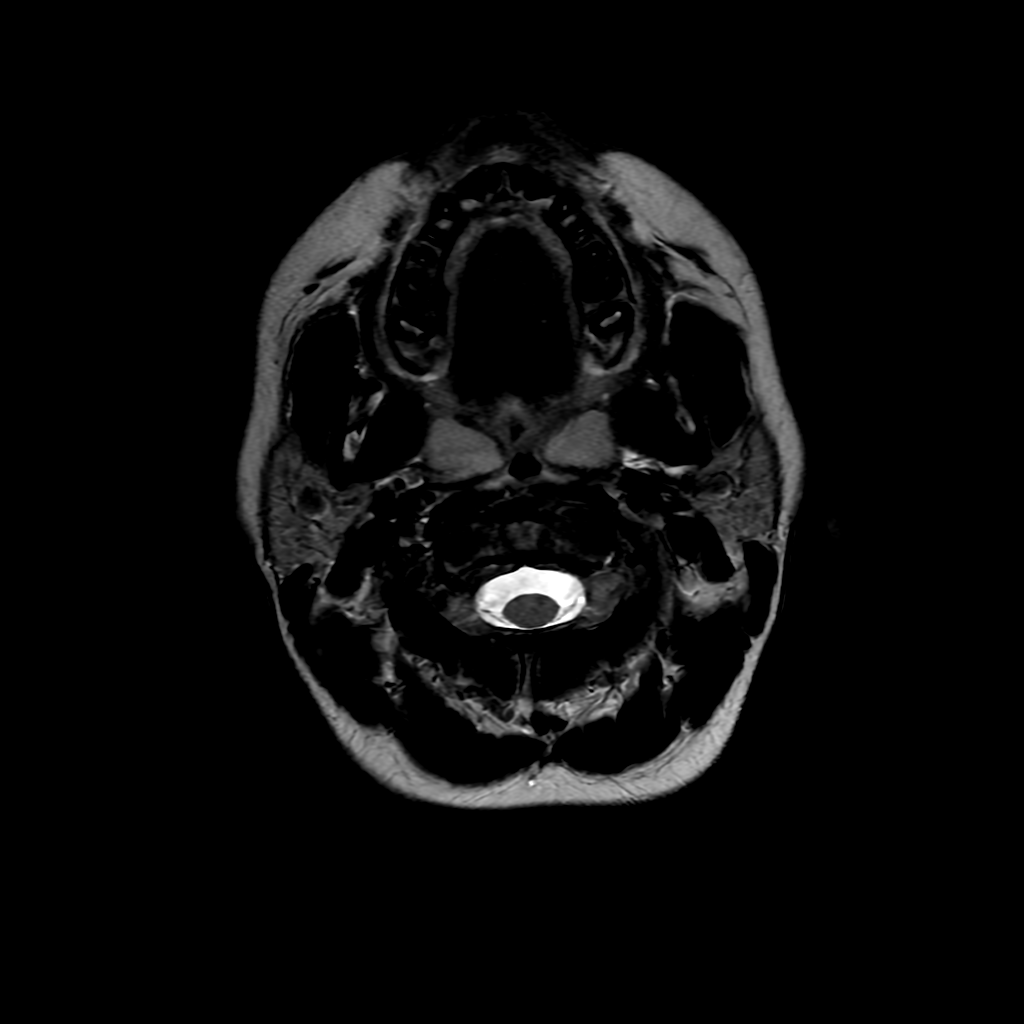

[Series 7: FLAIR · axial · 3.0mm · 0.47mm/px · 1 of 26 slices shown (2 of 2)]
[im 1/26]
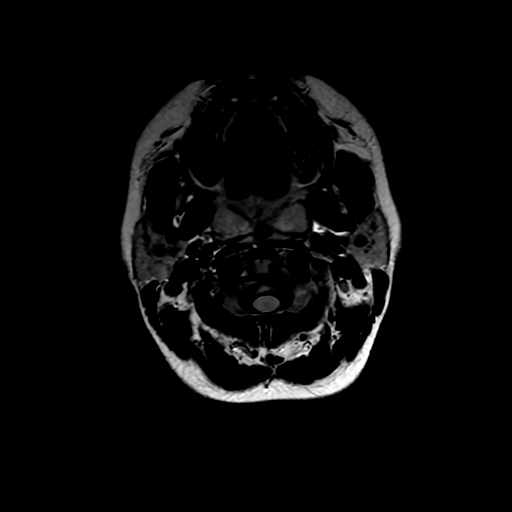

[Series 10: T2 · coronal · 5.0mm · 0.20mm/px · 1 of 27 slices shown (2 of 2)]
[im 1/27]
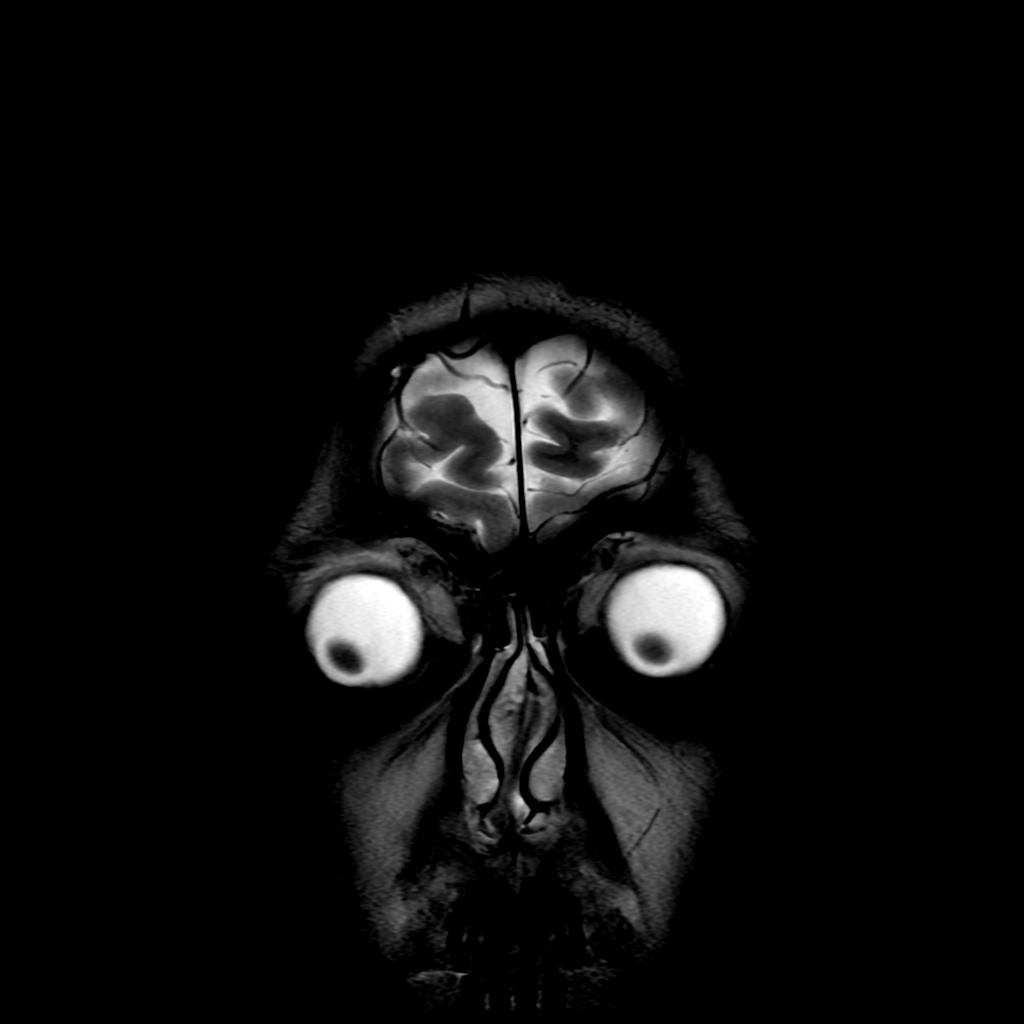

[Series 14: T1 · axial · 3.0mm · 1.00mm/px · 1 of 52 slices shown (1 of 2)]
[im 1/52]
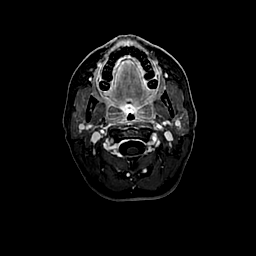

[Series 15: T1 · coronal · 5.0mm · 0.39mm/px · 1 of 27 slices shown (2 of 2)]
[im 1/27]
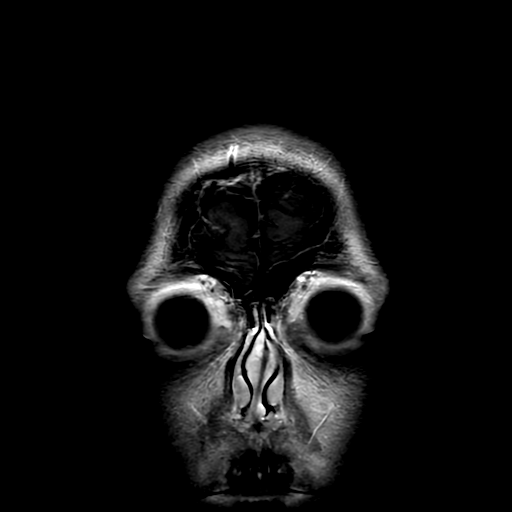

[Series 250: ADC · axial · 3.0mm · 0.94mm/px · 1 of 49 slices shown (1 of 2)]
[im 1/49]
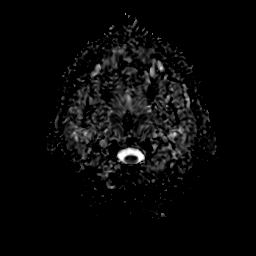

[Series 450: ADC · coronal · 4.0mm · 0.94mm/px · 1 of 34 slices shown (2 of 2)]
[im 1/34]
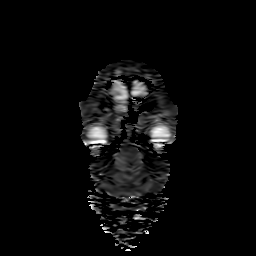

[14 of 48 positions shown; findings below may reference images not displayed]

FINDINGS: MRI HEAD FINDINGS

Brain: Normal cerebral volume. No restricted diffusion to suggest
acute infarction. No midline shift, mass effect, evidence of mass
lesion, ventriculomegaly, extra-axial collection or acute
intracranial hemorrhage. Cervicomedullary junction and pituitary are
within normal limits.

Gray and white matter signal is within normal limits throughout the
brain. No definite encephalomalacia. No chronic cerebral blood
products. No abnormal enhancement identified. No dural thickening.

Vascular: Major intracranial vascular flow voids are preserved. The
major dural venous sinuses appear to be normally enhancing and
patent, see MRA and MRV findings below.

Skull and upper cervical spine: Negative visible cervical spine.
Visualized bone marrow signal is within normal limits.

Sinuses/Orbits: Negative orbits.  Paranasal sinuses are clear.

Other: Mastoids are clear. Visible internal auditory structures
appear normal. Scalp and face soft tissues appear negative.

MRA HEAD FINDINGS

Antegrade flow in the posterior circulation with fairly codominant
distal vertebral arteries. Normal left PICA origins, the right
occurs somewhat late. No distal vertebral stenosis. Patent
vertebrobasilar junction and basilar artery without stenosis. Normal
SCA and PCA origins. Posterior communicating arteries are diminutive
or absent. Normal bilateral PCA branches.

Antegrade flow in both ICA siphons. No siphon stenosis. Normal
ophthalmic artery origins. Normal carotid termini. Normal MCA and
ACA origins. Normal anterior communicating artery and visible ACA
branches. Both MCA M1 segments and MCA bifurcations are patent and
within normal limits. Visible bilateral MCA branches are within
normal limits.

MRV HEAD FINDINGS

Precontrast time-of-flight images reveal preserved flow signal in
the superior sagittal sinus, torcula, straight sinus, vein of PEETAR PAT,
internal cerebral veins, basal veins of PEETAR PAT, bilateral
transverse and sigmoid sinuses, and both IJ bulbs.

Post-contrast MRV images demonstrate normal enhancement of those
same venous vascular structures. There is also flow signal and
enhancement within the major cortical draining veins.
IMPRESSION: 1. Normal MRI appearance of the brain, no acute intracranial
abnormality.
2. Normal intracranial MRA.
3. Normal intracranial MRV.

## 2019-10-21 IMAGING — MR MR MRV HEAD WO/W CM
11 of 19 series · 14 of 48 positions shown · IV contrast (6 GAD)
Comparison: None.

CLINICAL DATA: 23-year-old female with persistent left side
headache. History of IV drug use and partially treated endocarditis.

EXAM:
MRI HEAD WITHOUT AND WITH CONTRAST
MRA HEAD WITHOUT CONTRAST
MRV HEAD WITHOUT AND WITH CONTRAST
TECHNIQUE: Multiplanar, multiecho pulse sequences of the brain and surrounding
structures were obtained without and with intravenous contrast.
Angiographic images of the Circle of Willis were obtained using MRA
technique without intravenous contrast.
Venographic images of the head were obtained using MRV technique
without and with intravenous contrast.
CONTRAST:  7mL GADAVIST GADOBUTROL 1 MMOL/ML IV SOLN

[Series 2: DWI · axial · 3.0mm · 0.94mm/px · z∈[-117,+29]mm · 2 of 100 slices shown (1 of 2)]
[im 1/100]
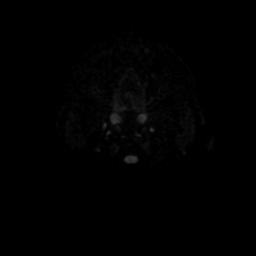
[im 100/100]
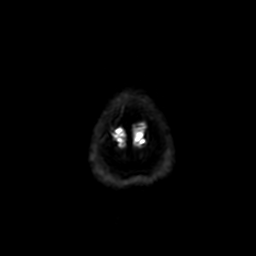

[Series 3: ax (id) · axial · 1.0mm · 0.43mm/px · z∈[-111,-80]mm · 2 of 184 slices shown]
[im 1/184]
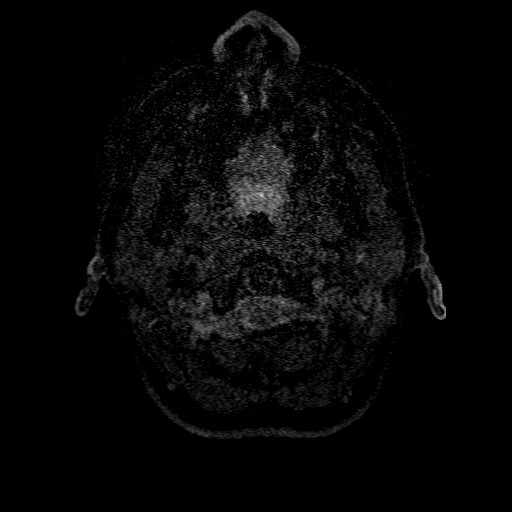
[im 62/184]
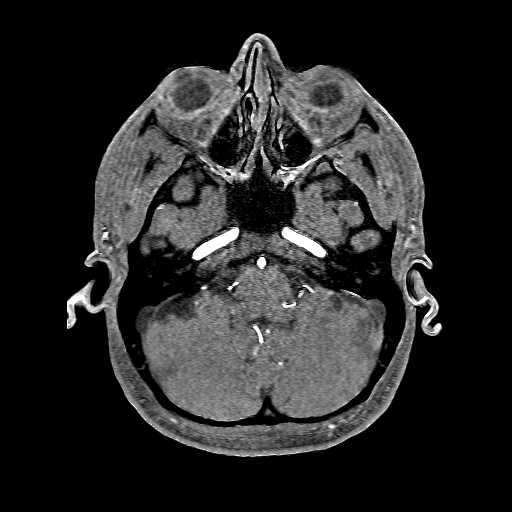

[Series 4: DWI · coronal · 4.0mm · 0.94mm/px · 2 of 74 slices shown (2 of 2)]
[im 1/74]
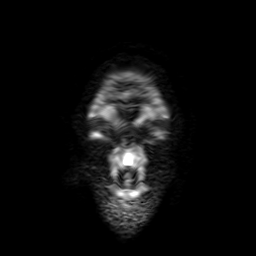
[im 74/74]
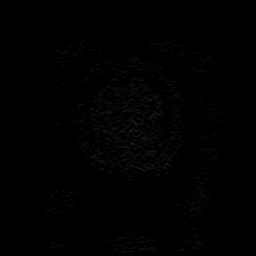

[Series 5: FLAIR · sagittal · 5.0mm · 0.23mm/px · 1 of 23 slices shown (1 of 2)]
[im 1/23]
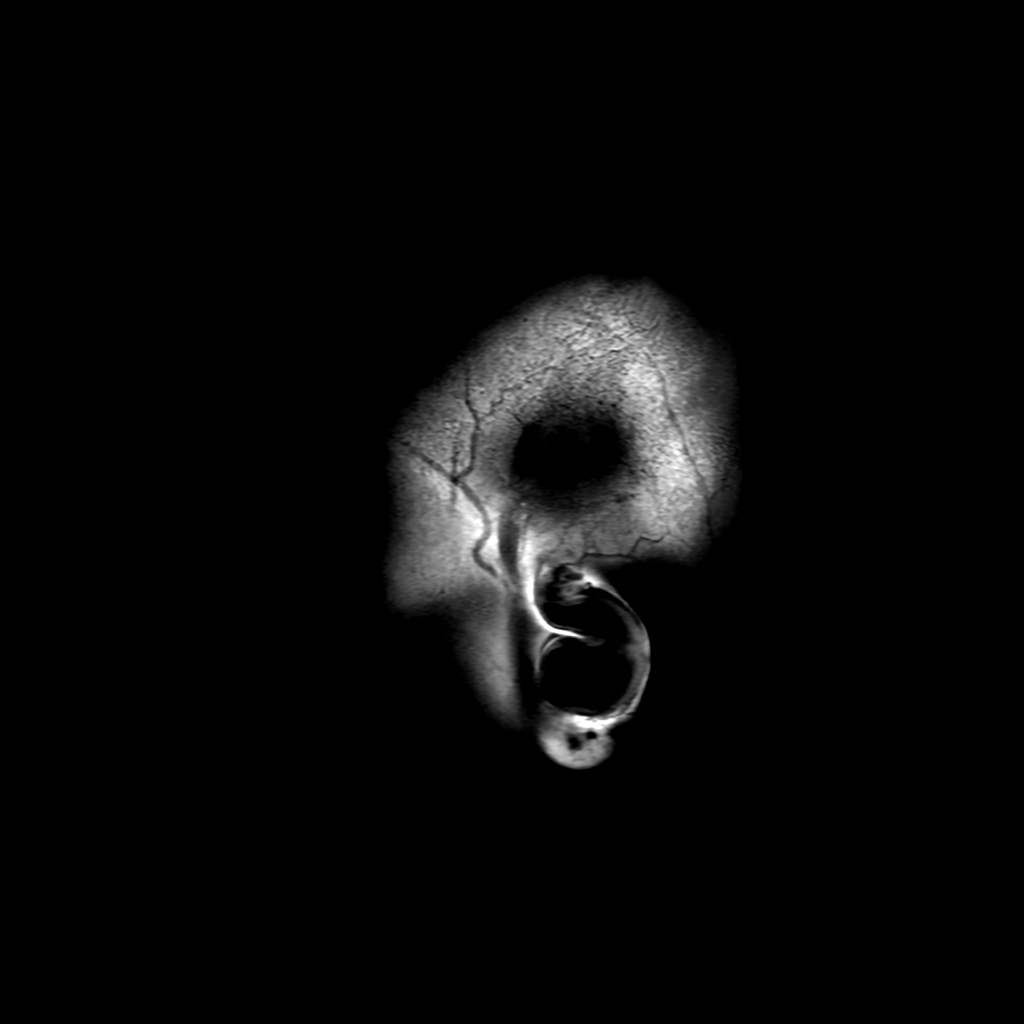

[Series 6: T2 · axial · 5.0mm · 0.23mm/px · 1 of 26 slices shown (1 of 2)]
[im 1/26]
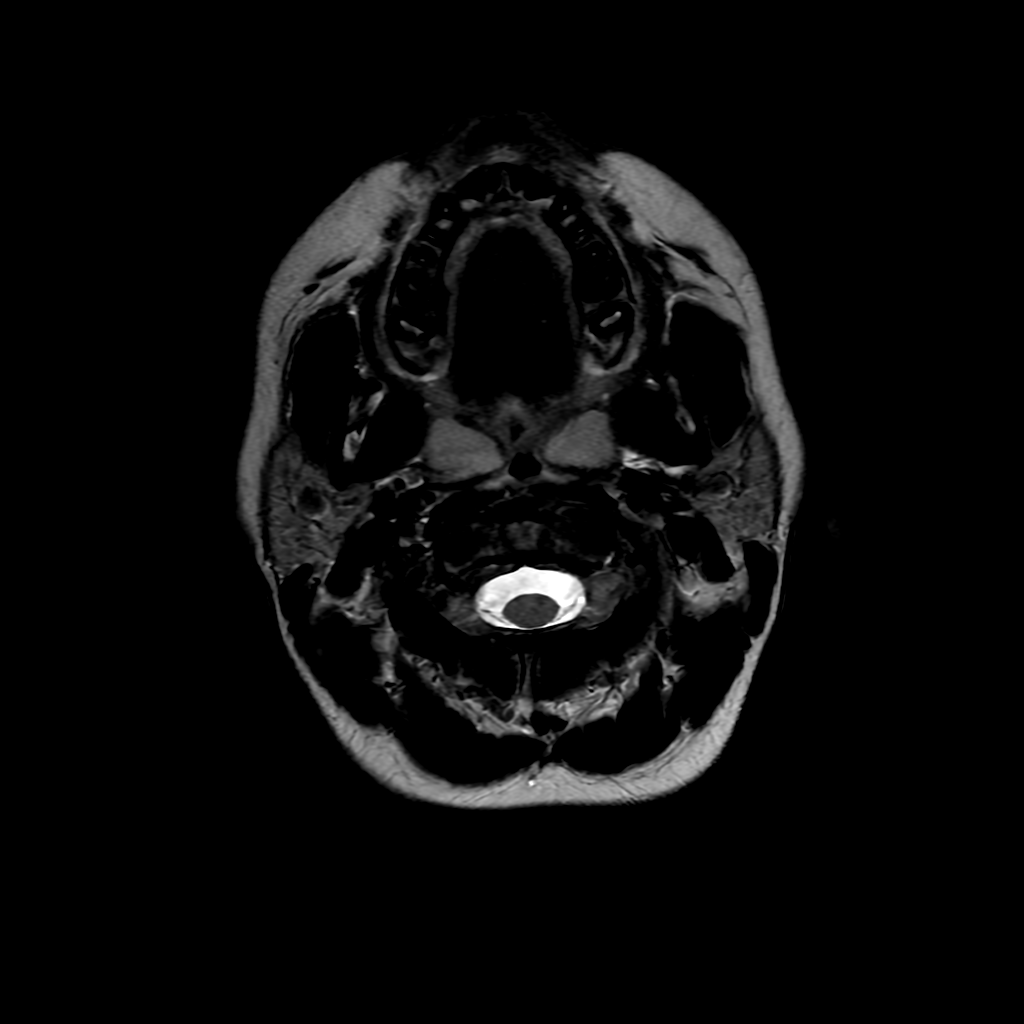

[Series 7: FLAIR · axial · 3.0mm · 0.47mm/px · 1 of 26 slices shown (2 of 2)]
[im 1/26]
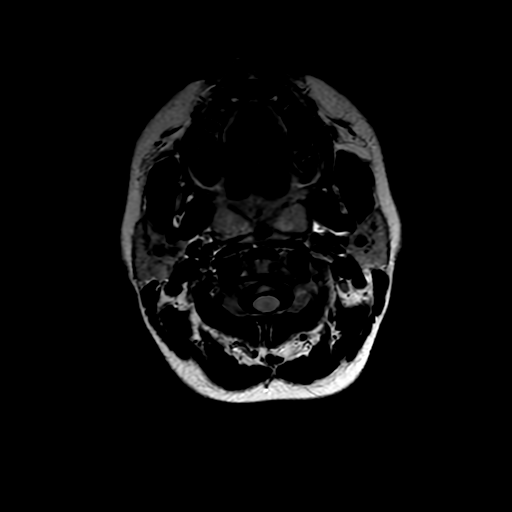

[Series 10: T2 · coronal · 5.0mm · 0.20mm/px · 1 of 27 slices shown (2 of 2)]
[im 1/27]
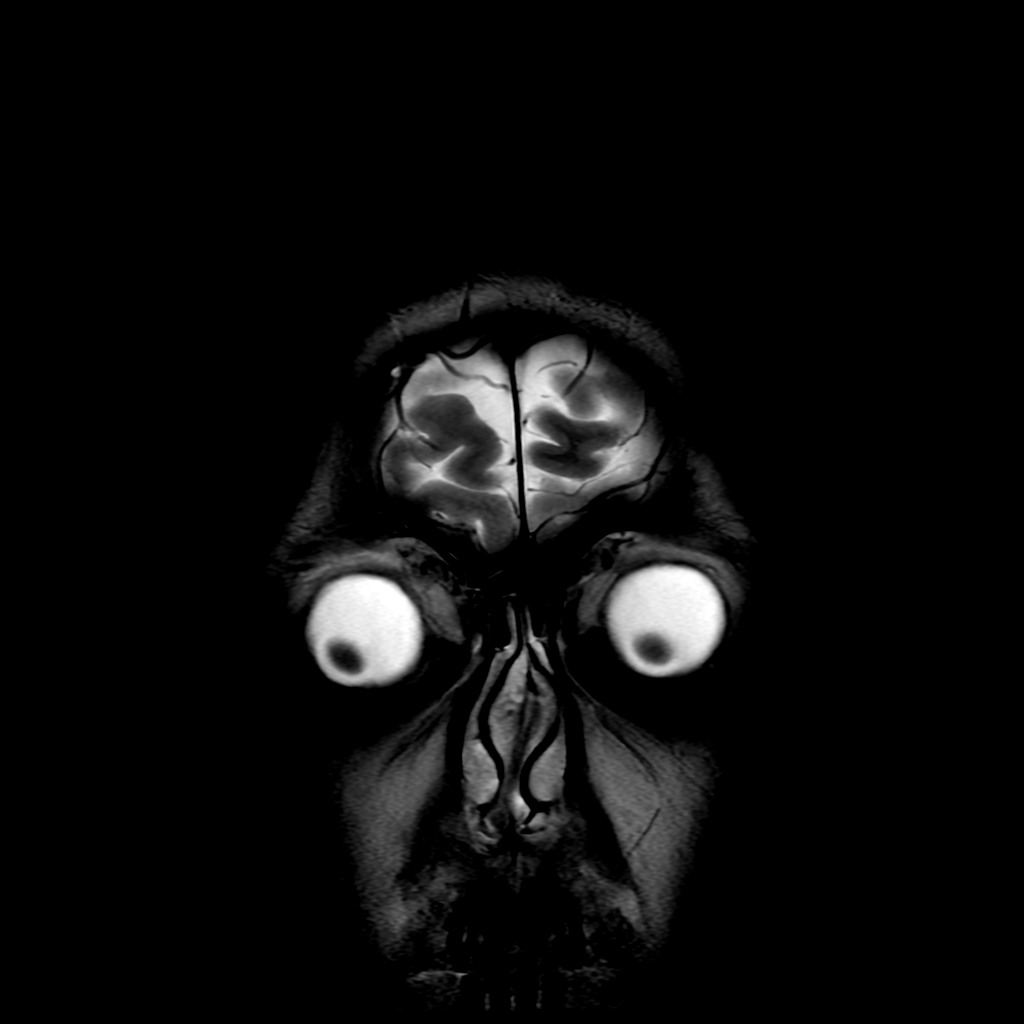

[Series 14: T1 · axial · 3.0mm · 1.00mm/px · 1 of 52 slices shown (1 of 2)]
[im 1/52]
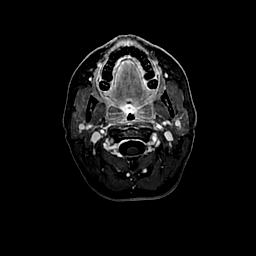

[Series 15: T1 · coronal · 5.0mm · 0.39mm/px · 1 of 27 slices shown (2 of 2)]
[im 1/27]
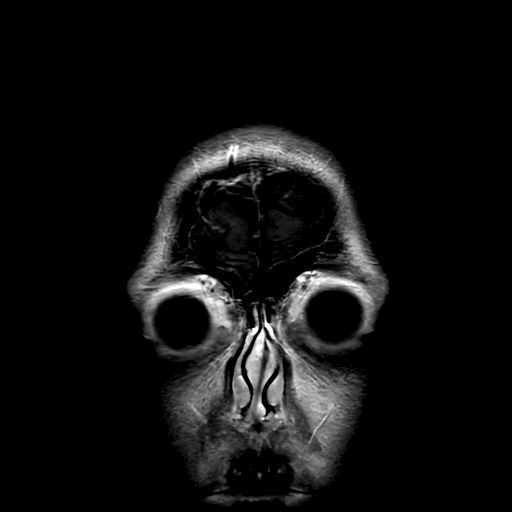

[Series 250: ADC · axial · 3.0mm · 0.94mm/px · 1 of 49 slices shown (1 of 2)]
[im 1/49]
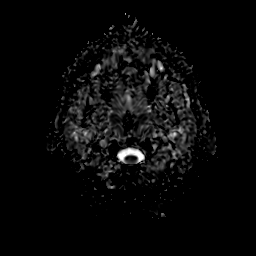

[Series 450: ADC · coronal · 4.0mm · 0.94mm/px · 1 of 34 slices shown (2 of 2)]
[im 1/34]
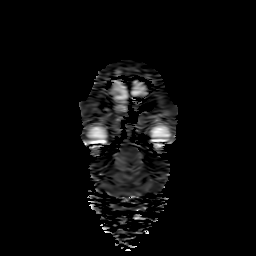

[14 of 48 positions shown; findings below may reference images not displayed]

FINDINGS: MRI HEAD FINDINGS

Brain: Normal cerebral volume. No restricted diffusion to suggest
acute infarction. No midline shift, mass effect, evidence of mass
lesion, ventriculomegaly, extra-axial collection or acute
intracranial hemorrhage. Cervicomedullary junction and pituitary are
within normal limits.

Gray and white matter signal is within normal limits throughout the
brain. No definite encephalomalacia. No chronic cerebral blood
products. No abnormal enhancement identified. No dural thickening.

Vascular: Major intracranial vascular flow voids are preserved. The
major dural venous sinuses appear to be normally enhancing and
patent, see MRA and MRV findings below.

Skull and upper cervical spine: Negative visible cervical spine.
Visualized bone marrow signal is within normal limits.

Sinuses/Orbits: Negative orbits.  Paranasal sinuses are clear.

Other: Mastoids are clear. Visible internal auditory structures
appear normal. Scalp and face soft tissues appear negative.

MRA HEAD FINDINGS

Antegrade flow in the posterior circulation with fairly codominant
distal vertebral arteries. Normal left PICA origins, the right
occurs somewhat late. No distal vertebral stenosis. Patent
vertebrobasilar junction and basilar artery without stenosis. Normal
SCA and PCA origins. Posterior communicating arteries are diminutive
or absent. Normal bilateral PCA branches.

Antegrade flow in both ICA siphons. No siphon stenosis. Normal
ophthalmic artery origins. Normal carotid termini. Normal MCA and
ACA origins. Normal anterior communicating artery and visible ACA
branches. Both MCA M1 segments and MCA bifurcations are patent and
within normal limits. Visible bilateral MCA branches are within
normal limits.

MRV HEAD FINDINGS

Precontrast time-of-flight images reveal preserved flow signal in
the superior sagittal sinus, torcula, straight sinus, vein of PEETAR PAT,
internal cerebral veins, basal veins of PEETAR PAT, bilateral
transverse and sigmoid sinuses, and both IJ bulbs.

Post-contrast MRV images demonstrate normal enhancement of those
same venous vascular structures. There is also flow signal and
enhancement within the major cortical draining veins.
IMPRESSION: 1. Normal MRI appearance of the brain, no acute intracranial
abnormality.
2. Normal intracranial MRA.
3. Normal intracranial MRV.

## 2019-10-21 IMAGING — MR MR MRA HEAD W/O CM
11 of 19 series · 14 of 48 positions shown · IV contrast (gadavist)
Comparison: None.

CLINICAL DATA: 23-year-old female with persistent left side
headache. History of IV drug use and partially treated endocarditis.

EXAM:
MRI HEAD WITHOUT AND WITH CONTRAST
MRA HEAD WITHOUT CONTRAST
MRV HEAD WITHOUT AND WITH CONTRAST
TECHNIQUE: Multiplanar, multiecho pulse sequences of the brain and surrounding
structures were obtained without and with intravenous contrast.
Angiographic images of the Circle of Willis were obtained using MRA
technique without intravenous contrast.
Venographic images of the head were obtained using MRV technique
without and with intravenous contrast.
CONTRAST:  7mL GADAVIST GADOBUTROL 1 MMOL/ML IV SOLN

[Series 2: DWI · axial · 3.0mm · 0.94mm/px · z∈[-117,+29]mm · 2 of 100 slices shown (1 of 2)]
[im 1/100]
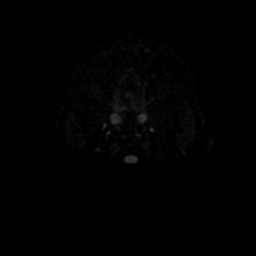
[im 100/100]
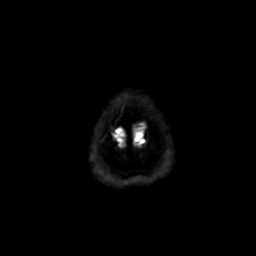

[Series 3: ax (id) · axial · 1.0mm · 0.43mm/px · z∈[-111,-80]mm · 2 of 184 slices shown]
[im 1/184]
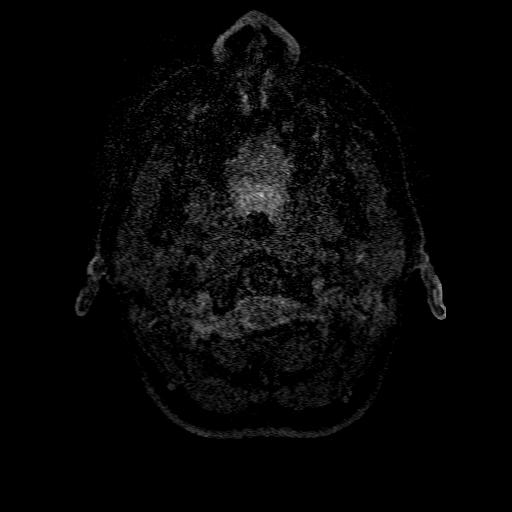
[im 62/184]
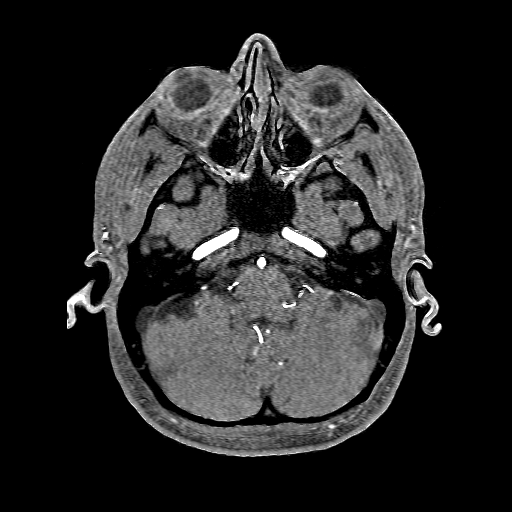

[Series 4: DWI · coronal · 4.0mm · 0.94mm/px · 2 of 74 slices shown (2 of 2)]
[im 1/74]
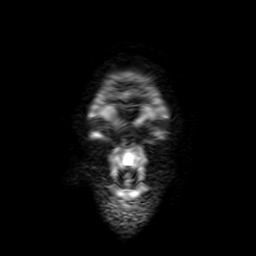
[im 74/74]
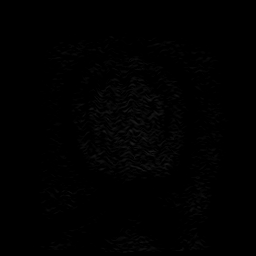

[Series 5: FLAIR · sagittal · 5.0mm · 0.23mm/px · 1 of 23 slices shown (1 of 2)]
[im 1/23]
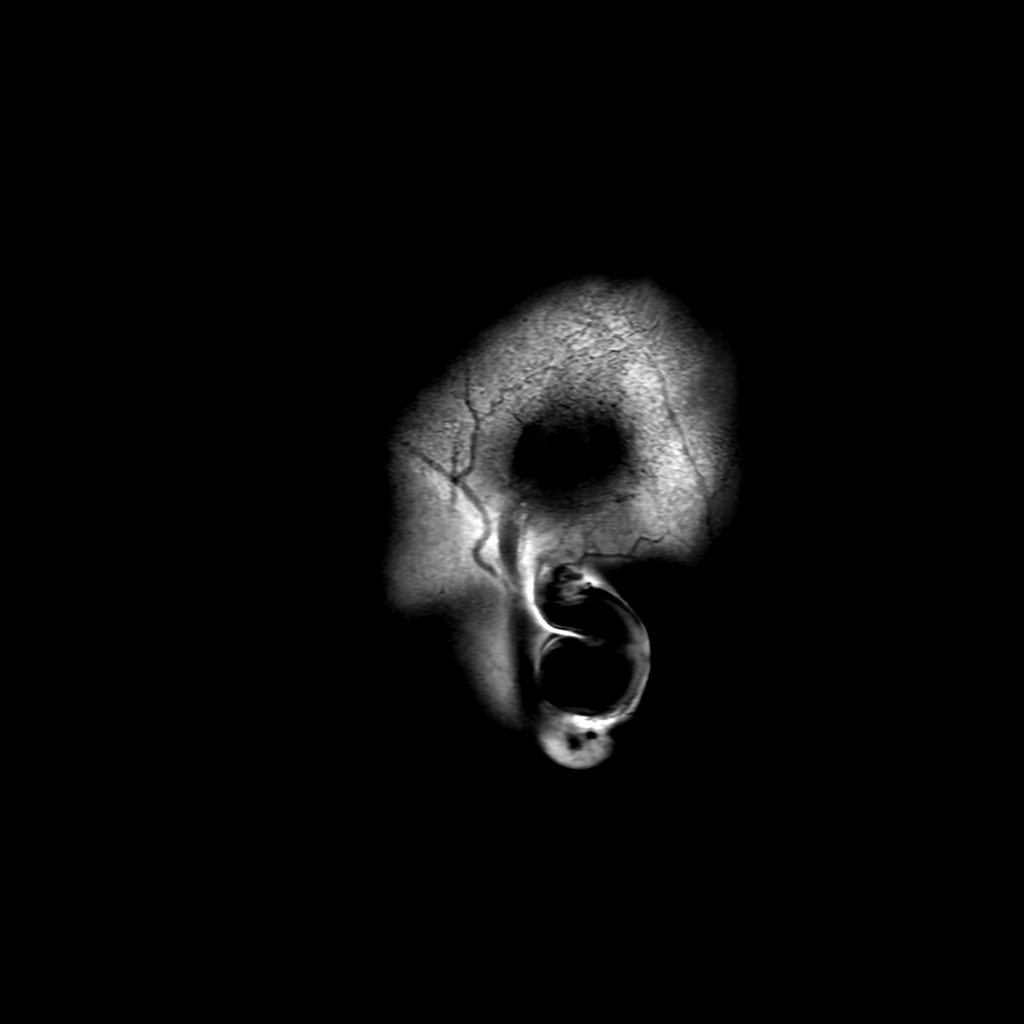

[Series 6: T2 · axial · 5.0mm · 0.23mm/px · 1 of 26 slices shown (1 of 2)]
[im 1/26]
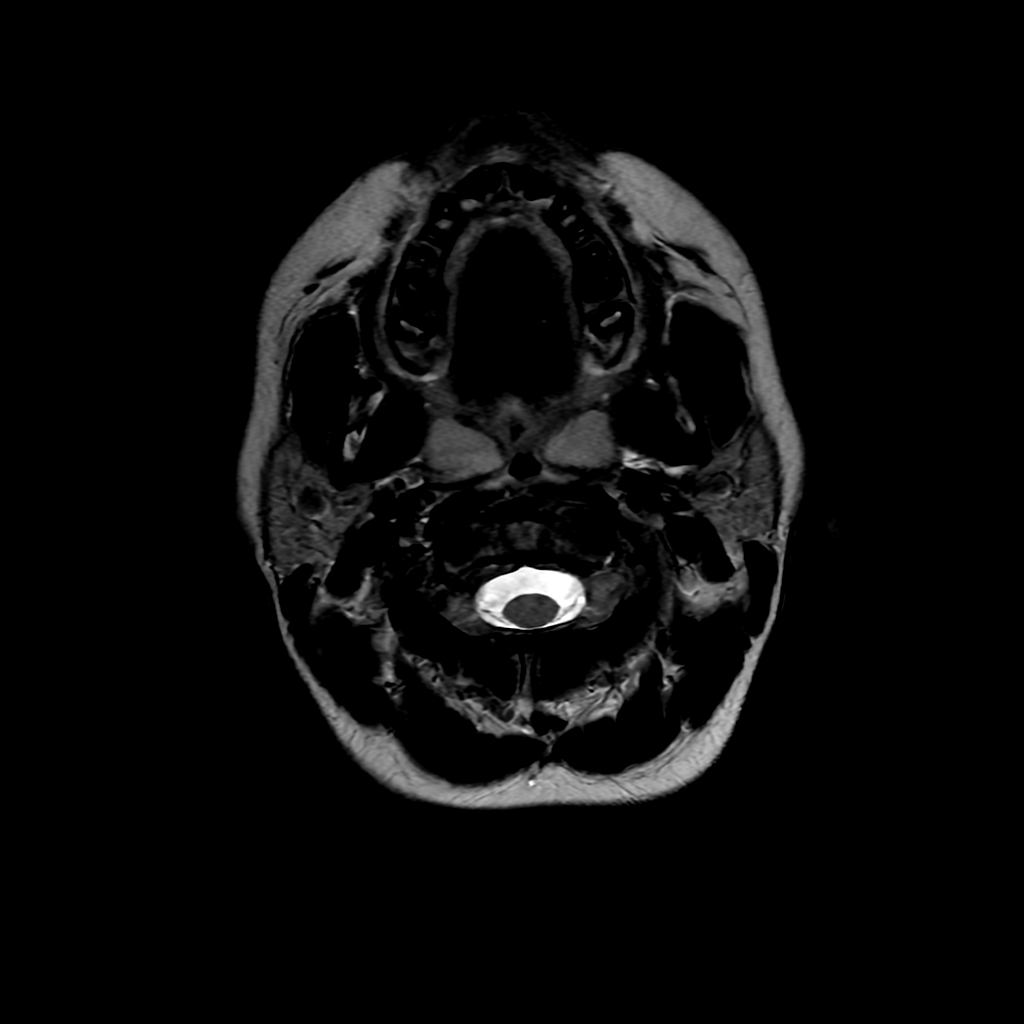

[Series 7: FLAIR · axial · 3.0mm · 0.47mm/px · 1 of 26 slices shown (2 of 2)]
[im 1/26]
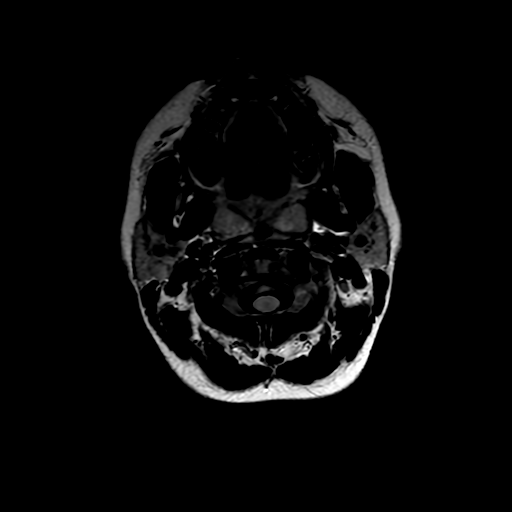

[Series 10: T2 · coronal · 5.0mm · 0.20mm/px · 1 of 27 slices shown (2 of 2)]
[im 1/27]
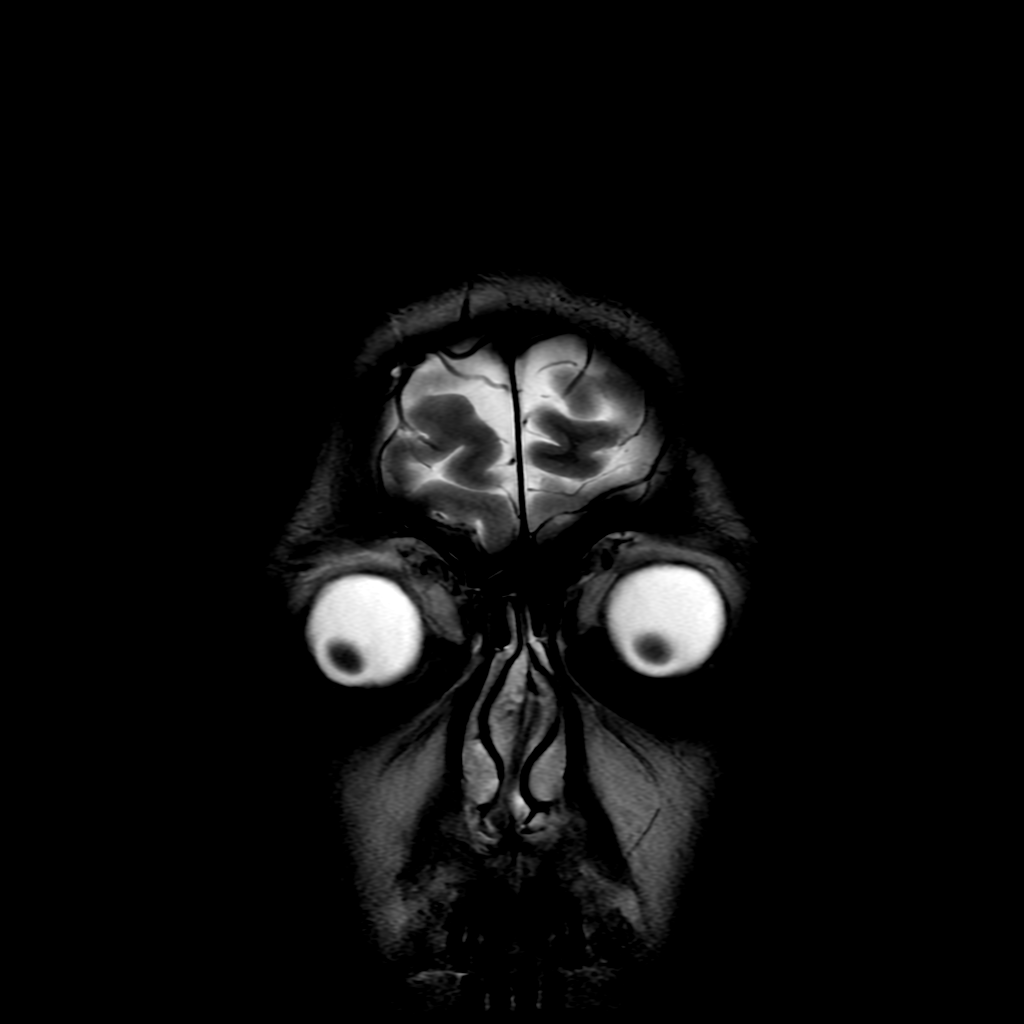

[Series 14: T1 · axial · 3.0mm · 1.00mm/px · 1 of 52 slices shown (1 of 2)]
[im 1/52]
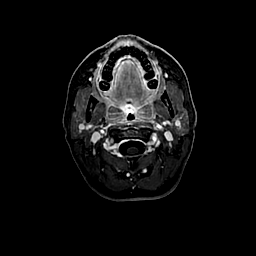

[Series 15: T1 · coronal · 5.0mm · 0.39mm/px · 1 of 27 slices shown (2 of 2)]
[im 1/27]
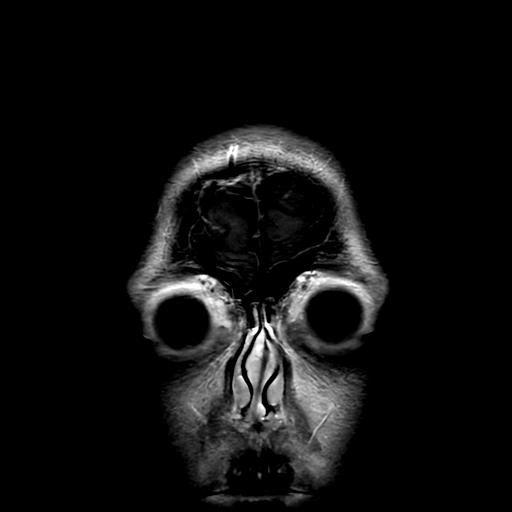

[Series 250: ADC · axial · 3.0mm · 0.94mm/px · 1 of 49 slices shown (1 of 2)]
[im 1/49]
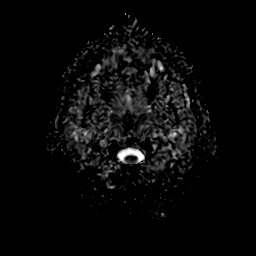

[Series 450: ADC · coronal · 4.0mm · 0.94mm/px · 1 of 34 slices shown (2 of 2)]
[im 1/34]
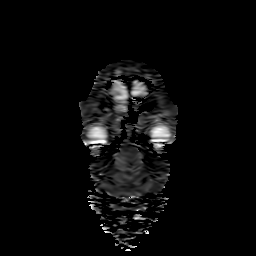

[14 of 48 positions shown; findings below may reference images not displayed]

FINDINGS: MRI HEAD FINDINGS

Brain: Normal cerebral volume. No restricted diffusion to suggest
acute infarction. No midline shift, mass effect, evidence of mass
lesion, ventriculomegaly, extra-axial collection or acute
intracranial hemorrhage. Cervicomedullary junction and pituitary are
within normal limits.

Gray and white matter signal is within normal limits throughout the
brain. No definite encephalomalacia. No chronic cerebral blood
products. No abnormal enhancement identified. No dural thickening.

Vascular: Major intracranial vascular flow voids are preserved. The
major dural venous sinuses appear to be normally enhancing and
patent, see MRA and MRV findings below.

Skull and upper cervical spine: Negative visible cervical spine.
Visualized bone marrow signal is within normal limits.

Sinuses/Orbits: Negative orbits.  Paranasal sinuses are clear.

Other: Mastoids are clear. Visible internal auditory structures
appear normal. Scalp and face soft tissues appear negative.

MRA HEAD FINDINGS

Antegrade flow in the posterior circulation with fairly codominant
distal vertebral arteries. Normal left PICA origins, the right
occurs somewhat late. No distal vertebral stenosis. Patent
vertebrobasilar junction and basilar artery without stenosis. Normal
SCA and PCA origins. Posterior communicating arteries are diminutive
or absent. Normal bilateral PCA branches.

Antegrade flow in both ICA siphons. No siphon stenosis. Normal
ophthalmic artery origins. Normal carotid termini. Normal MCA and
ACA origins. Normal anterior communicating artery and visible ACA
branches. Both MCA M1 segments and MCA bifurcations are patent and
within normal limits. Visible bilateral MCA branches are within
normal limits.

MRV HEAD FINDINGS

Precontrast time-of-flight images reveal preserved flow signal in
the superior sagittal sinus, torcula, straight sinus, vein of PEETAR PAT,
internal cerebral veins, basal veins of PEETAR PAT, bilateral
transverse and sigmoid sinuses, and both IJ bulbs.

Post-contrast MRV images demonstrate normal enhancement of those
same venous vascular structures. There is also flow signal and
enhancement within the major cortical draining veins.
IMPRESSION: 1. Normal MRI appearance of the brain, no acute intracranial
abnormality.
2. Normal intracranial MRA.
3. Normal intracranial MRV.

## 2019-10-21 MED ORDER — ACETYLCYSTEINE 20 % IN SOLN
140.0000 mg/kg | Freq: Once | RESPIRATORY_TRACT | Status: AC
  Administered 2019-10-21: 10160 mg via ORAL
  Filled 2019-10-21 (×2): qty 60

## 2019-10-21 MED ORDER — GADOBUTROL 1 MMOL/ML IV SOLN
7.0000 mL | Freq: Once | INTRAVENOUS | Status: AC | PRN
  Administered 2019-10-21: 7 mL via INTRAVENOUS

## 2019-10-21 MED ORDER — DIPHENHYDRAMINE HCL 25 MG PO CAPS
25.0000 mg | ORAL_CAPSULE | Freq: Once | ORAL | Status: AC
  Administered 2019-10-21: 25 mg via ORAL
  Filled 2019-10-21: qty 1

## 2019-10-21 MED ORDER — PENICILLIN V POTASSIUM 250 MG PO TABS
500.0000 mg | ORAL_TABLET | Freq: Once | ORAL | Status: AC
  Administered 2019-10-21: 500 mg via ORAL
  Filled 2019-10-21: qty 2

## 2019-10-21 MED ORDER — LACTATED RINGERS IV BOLUS
1000.0000 mL | Freq: Once | INTRAVENOUS | Status: AC
  Administered 2019-10-21: 1000 mL via INTRAVENOUS

## 2019-10-21 MED ORDER — METOCLOPRAMIDE HCL 10 MG PO TABS
10.0000 mg | ORAL_TABLET | Freq: Once | ORAL | Status: AC
  Administered 2019-10-21: 10 mg via ORAL
  Filled 2019-10-21: qty 1

## 2019-10-21 MED ORDER — ACETAMINOPHEN 325 MG PO TABS
650.0000 mg | ORAL_TABLET | Freq: Once | ORAL | Status: AC
  Administered 2019-10-21: 650 mg via ORAL
  Filled 2019-10-21: qty 2

## 2019-10-21 NOTE — ED Triage Notes (Signed)
Pt presents w/Left side headache and toothache x7 days. Pt reports a hx of endocarditis

## 2019-10-21 NOTE — ED Provider Notes (Signed)
Physical Exam  BP (!) 100/58   Pulse 81   Temp 98.7 F (37.1 C) (Oral)   Resp 16   Wt 72.6 kg   SpO2 98%   BMI 26.63 kg/m   Physical Exam Vitals and nursing note reviewed.  Constitutional:      General: She is not in acute distress.    Appearance: She is well-developed. She is not diaphoretic.  HENT:     Head: Normocephalic and atraumatic.  Eyes:     General: No scleral icterus.    Conjunctiva/sclera: Conjunctivae normal.  Pulmonary:     Effort: Pulmonary effort is normal. No respiratory distress.  Musculoskeletal:     Cervical back: Normal range of motion.  Skin:    Findings: No rash.  Neurological:     Mental Status: She is alert.     ED Course/Procedures   Clinical Course as of Oct 20 2156  Wed Oct 21, 2019  2124 Patient denies vomiting.   [HK]    Clinical Course User Index [HK] Delia Heady, PA-C    Procedures  MDM   Care of patient assumed from [place] at 7:00 PM.  Agree with history, physical exam and plan.  See their note for further details.  Briefly, 24 y.o. female with PMH/PSH as below who presents with headache and concerns for salicylate toxicity.  She is a past medical history of endocarditis with septic emboli that was not completely treated during her hospitalization last year as she left AMA.  She complains of headache and is concerned that she has had similar type of pain as she did with septic emboli.  Neurology was consulted who recommended MRIs of the head which were negative on today's work-up. She is also been taking 10 to 15 tablets of ibuprofen every 4 hours as well as BC powders every 2 hours.  She complains of tinnitus and vomiting.  Poison control was consulted.  Initial work-up showing therapeutic salicylate level, elevation in LFTs higher than prior with normal bilirubin normal creatinine, low bicarb at 19.  Past Medical History:  Diagnosis Date  . Drug addict ([place])   . Hepatitis C    No past surgical history on file.     Current Plan: Poison control consulted again and recommends repeat salicylate level, repeat CMP, Tylenol level, VBG and to give a bolus of NAC while awaiting results. If her LFTs and her Tylenol level have increased she will need to be started on a NAC infusion and admitted to the hospital.    MDM/ED Course:  9:16 PM Informed poison control of results of repeat lab work.  Due to elevation in LFTs they recommend giving patient more NAC.  Recommend 70 mg/KG every 4 hours for 5 doses.  Get AST/ALT after all 5 doses to ensure that it is trending down.  I then told poison control that her LFTs have been trending down since we first evaluated her based on this recheck.  States that they will speak to toxicologist and call me back.  9:57 PM Again spoke to poison control who states that patient does not need additional doses of NAC as her LFTs are trending down as well as her salicylate level.  I discussed this again with Dr. Sabra Heck who is in agreement that patient is appropriate for discharge.  Hepatitis panel is pending and told patient to follow-up with this as well as follow-up with GI doctor regarding her transaminitis.  She is already in touch with social work regarding getting  her antibiotics filled.  In the meantime told her to decrease the use of ibuprofen and Tylenol and to follow-up with her PCP.   Consults: Poison control   Significant labs/images: Labs Reviewed  ACETAMINOPHEN LEVEL - Abnormal; Notable for the following components:      Result Value   Acetaminophen (Tylenol), Serum <10 (*)    All other components within normal limits  COMPREHENSIVE METABOLIC PANEL - Abnormal; Notable for the following components:   CO2 19 (*)    Calcium 8.1 (*)    Albumin 3.3 (*)    AST 158 (*)    ALT 323 (*)    All other components within normal limits  ACETAMINOPHEN LEVEL - Abnormal; Notable for the following components:   Acetaminophen (Tylenol), Serum <10 (*)    All other components  within normal limits  COMPREHENSIVE METABOLIC PANEL - Abnormal; Notable for the following components:   CO2 20 (*)    Glucose, Bld 107 (*)    Calcium 7.8 (*)    Total Protein 6.0 (*)    Albumin 3.0 (*)    AST 134 (*)    ALT 279 (*)    All other components within normal limits  POCT I-STAT EG7 - Abnormal; Notable for the following components:   pH, Ven 7.515 (*)    pCO2, Ven 29.1 (*)    pO2, Ven 212.0 (*)    Calcium, Ion 0.89 (*)    HCT 34.0 (*)    Hemoglobin 11.6 (*)    All other components within normal limits  CBC WITH DIFFERENTIAL/PLATELET  SALICYLATE LEVEL  RAPID URINE DRUG SCREEN, HOSP PERFORMED  MAGNESIUM  LACTIC ACID, PLASMA  LACTIC ACID, PLASMA  SALICYLATE LEVEL  HEPATITIS PANEL, ACUTE  I-STAT BETA HCG BLOOD, ED ([facility], [facility], [facility] ONLY)  I-STAT VENOUS BLOOD GAS, ED    I personally reviewed and interpreted all labs.  The plan for this patient was discussed with Dr. Hyacinth Meeker, who voiced agreement and who oversaw evaluation and treatment of this patient.          Dietrich Pates, PA-C 10/21/19 2158    Eber Hong, MD 10/23/19 [phone]

## 2019-10-21 NOTE — ED Provider Notes (Signed)
[facility] EMERGENCY DEPARTMENT Provider Note   CSN: [id number] Arrival date & time: 10/21/19  1326     History Chief Complaint  Patient presents with  . Headache  . Dental Pain    Tracy Fowler is a 24 y.o. female.  HPI   24 year old female with history of hep C, MRSA bacteremia, endocarditis with septic emboli, substance use, who presents the emergency department today for evaluation of multiple complaints.  Patient states that she has a fractured tooth and had some pus coming out of her left upper molar about a week ago.  She has had severe pain since then and has had some swelling to the left side of her face.  She has tried to follow-up with the maxillofacial surgeon but due to financial reason she has not done so yet.  Reviewed records per epic, she was seen at an outside ED yesterday for evaluation of similar symptoms and was given Rx for Pen-VK which she states she could not fill because of financial reasons.  She also complains of left sided headache for the last 7 to 8 days.  Pain is constant in nature and throbbing sensation.  Pain is severe.  Intermittently radiates to the right side of her head.  She has had no persistent vision changes but states she sometimes has some dizziness upon standing.  She denies any unilateral lateral numbness/weakness.  She is complaining of tinnitus and decreased hearing on the left side. She was dx with cerumen impaction yesterday and had was given debrox in the Ed. She notes she had some nausea and vomiting yesterday. She states her pain feels like when she had "an infected clot in my lungs". She states she is concerned for a clot in her brain.    Past Medical History:  Diagnosis Date  . Drug addict (HCC)   . Hepatitis C     Patient Active Problem List   Diagnosis Date Noted  . MRSA bacteremia 02/18/2019  . Opioid dependence with withdrawal (HCC) 02/18/2019  . Septic embolism (HCC) 02/17/2019  . Injection of illicit  drug within last 12 months 02/17/2019  . Transaminitis 02/17/2019    No past surgical history on file.   OB History   No obstetric history on file.     No family history on file.  Social History   Tobacco Use  . Smoking status: Current Every Day Smoker  . Smokeless tobacco: Never Used  Substance Use Topics  . Alcohol use: No  . Drug use: Yes    Comment: prescription pain killers n past;  pt states she does not use them any more    Home Medications Prior to Admission medications   Medication Sig Start Date End Date Taking? Authorizing Provider  Aspirin-Salicylamide-Caffeine (BC HEADACHE POWDER PO) Take 1-4 packets by mouth See admin instructions. 3-4 times daily for pain.   Yes [provider]  ibuprofen (ADVIL) 200 MG tablet Take 3,600 mg by mouth See admin instructions. Six times daily   Yes [provider]    Allergies    Prednisone  Review of Systems   Review of Systems  Constitutional: Negative for fever.  HENT: Positive for dental problem. Negative for ear pain and sore throat.   Eyes: Negative for visual disturbance.  Respiratory: Negative for cough and shortness of breath.   Cardiovascular: Negative for chest pain.  Gastrointestinal: Negative for abdominal pain, nausea and vomiting.  Genitourinary: Negative for dysuria and hematuria.  Musculoskeletal: Negative for back  pain and neck pain.  Skin: Negative for color change and rash.  Neurological: Positive for dizziness and headaches. Negative for weakness, light-headedness and numbness.  All other systems reviewed and are negative.   Physical Exam Updated Vital Signs BP 103/66   Pulse 85   Temp 98.7 F (37.1 C) (Oral)   Resp 16   Wt 72.6 kg   SpO2 100%   BMI 26.63 kg/m   Physical Exam Vitals and nursing note reviewed.  Constitutional:      General: She is not in acute distress.    Appearance: She is well-developed.  HENT:     Head: Normocephalic and atraumatic.      Mouth/Throat:     Comments: Tooth number 14 has evidence of fracture and dental carie. tooth is tender to percussion. There is TTP to the gumline adjacent to the tooth Eyes:     Extraocular Movements: Extraocular movements intact.     Right eye: No nystagmus.     Left eye: No nystagmus.     Conjunctiva/sclera: Conjunctivae normal.     Pupils: Pupils are equal, round, and reactive to light.  Cardiovascular:     Rate and Rhythm: Normal rate and regular rhythm.     Heart sounds: No murmur.  Pulmonary:     Effort: Pulmonary effort is normal. No respiratory distress.     Breath sounds: Normal breath sounds.  Abdominal:     Palpations: Abdomen is soft.     Tenderness: There is no abdominal tenderness.  Musculoskeletal:     Cervical back: Neck supple.  Skin:    General: Skin is warm and dry.  Neurological:     Mental Status: She is alert.     Comments: Mental Status:  Alert, thought content appropriate, able to give a coherent history. Speech fluent without evidence of aphasia. Able to follow 2 step commands without difficulty.  Cranial Nerves:  II:  Peripheral visual fields grossly normal, pupils equal, round, reactive to light III,IV, VI: ptosis not present, extra-ocular motions intact bilaterally  V,VII: smile symmetric, decreased sensation to the left side of the face VIII: hearing grossly normal to voice  X: uvula elevates symmetrically  XI: bilateral shoulder shrug symmetric and strong XII: midline tongue extension without fassiculations Motor:  Normal tone. 5/5 strength of BUE and BLE major muscle groups including strong and equal grip strength and dorsiflexion/plantar flexion Sensory: decreased sensation to the LUE, otherwise wnl Cerebellar: normal finger-to-nose with bilateral upper extremities Gait: normal gait and balance. Able to walk on toes and heels with ease.       ED Results / Procedures / Treatments   Labs (all labs ordered are listed, but only abnormal results  are displayed) Labs Reviewed  ACETAMINOPHEN LEVEL - Abnormal; Notable for the following components:      Result Value   Acetaminophen (Tylenol), Serum <10 (*)    All other components within normal limits  COMPREHENSIVE METABOLIC PANEL - Abnormal; Notable for the following components:   CO2 19 (*)    Calcium 8.1 (*)    Albumin 3.3 (*)    AST 158 (*)    ALT 323 (*)    All other components within normal limits  CBC WITH DIFFERENTIAL/PLATELET  SALICYLATE LEVEL  MAGNESIUM  LACTIC ACID, PLASMA  RAPID URINE DRUG SCREEN, HOSP PERFORMED  LACTIC ACID, PLASMA  HEPATITIS PANEL, ACUTE  SALICYLATE LEVEL  ACETAMINOPHEN LEVEL  COMPREHENSIVE METABOLIC PANEL  I-STAT BETA HCG BLOOD, ED ([facility], [facility], [facility] ONLY)  I-STAT VENOUS  BLOOD GAS, ED    EKG None  Radiology MR ANGIO HEAD WO CONTRAST  Result Date: 10/21/2019 CLINICAL DATA:  24 year old female with persistent left side headache. History of IV drug use and partially treated endocarditis. EXAM: MRI HEAD WITHOUT AND WITH CONTRAST MRA HEAD WITHOUT CONTRAST MRV HEAD WITHOUT AND WITH CONTRAST TECHNIQUE: Multiplanar, multiecho pulse sequences of the brain and surrounding structures were obtained without and with intravenous contrast. Angiographic images of the Circle of Willis were obtained using MRA technique without intravenous contrast. Venographic images of the head were obtained using MRV technique without and with intravenous contrast. CONTRAST:  91mL GADAVIST GADOBUTROL 1 MMOL/ML IV SOLN COMPARISON:  None. FINDINGS: MRI HEAD FINDINGS Brain: Normal cerebral volume. No restricted diffusion to suggest acute infarction. No midline shift, mass effect, evidence of mass lesion, ventriculomegaly, extra-axial collection or acute intracranial hemorrhage. Cervicomedullary junction and pituitary are within normal limits. Pearline Cables and white matter signal is within normal limits throughout the brain. No definite encephalomalacia. No chronic cerebral blood products. No  abnormal enhancement identified. No dural thickening. Vascular: Major intracranial vascular flow voids are preserved. The major dural venous sinuses appear to be normally enhancing and patent, see MRA and MRV findings below. Skull and upper cervical spine: Negative visible cervical spine. Visualized bone marrow signal is within normal limits. Sinuses/Orbits: Negative orbits.  Paranasal sinuses are clear. Other: Mastoids are clear. Visible internal auditory structures appear normal. Scalp and face soft tissues appear negative. MRA HEAD FINDINGS Antegrade flow in the posterior circulation with fairly codominant distal vertebral arteries. Normal left PICA origins, the right occurs somewhat late. No distal vertebral stenosis. Patent vertebrobasilar junction and basilar artery without stenosis. Normal SCA and PCA origins. Posterior communicating arteries are diminutive or absent. Normal bilateral PCA branches. Antegrade flow in both ICA siphons. No siphon stenosis. Normal ophthalmic artery origins. Normal carotid termini. Normal MCA and ACA origins. Normal anterior communicating artery and visible ACA branches. Both MCA M1 segments and MCA bifurcations are patent and within normal limits. Visible bilateral MCA branches are within normal limits. MRV HEAD FINDINGS Precontrast time-of-flight images reveal preserved flow signal in the superior sagittal sinus, torcula, straight sinus, vein of Galen, internal cerebral veins, basal veins of Rosenthal, bilateral transverse and sigmoid sinuses, and both IJ bulbs. Post-contrast MRV images demonstrate normal enhancement of those same venous vascular structures. There is also flow signal and enhancement within the major cortical draining veins. IMPRESSION: 1. Normal MRI appearance of the brain, no acute intracranial abnormality. 2. Normal intracranial MRA. 3. Normal intracranial MRV. Electronically Signed   By: Genevie Ann M.D.   On: 10/21/2019 18:08   MR Brain W and Wo  Contrast  Result Date: 10/21/2019 CLINICAL DATA:  24 year old female with persistent left side headache. History of IV drug use and partially treated endocarditis. EXAM: MRI HEAD WITHOUT AND WITH CONTRAST MRA HEAD WITHOUT CONTRAST MRV HEAD WITHOUT AND WITH CONTRAST TECHNIQUE: Multiplanar, multiecho pulse sequences of the brain and surrounding structures were obtained without and with intravenous contrast. Angiographic images of the Circle of Willis were obtained using MRA technique without intravenous contrast. Venographic images of the head were obtained using MRV technique without and with intravenous contrast. CONTRAST:  60mL GADAVIST GADOBUTROL 1 MMOL/ML IV SOLN COMPARISON:  None. FINDINGS: MRI HEAD FINDINGS Brain: Normal cerebral volume. No restricted diffusion to suggest acute infarction. No midline shift, mass effect, evidence of mass lesion, ventriculomegaly, extra-axial collection or acute intracranial hemorrhage. Cervicomedullary junction and pituitary are within normal limits. Pearline Cables and white matter  signal is within normal limits throughout the brain. No definite encephalomalacia. No chronic cerebral blood products. No abnormal enhancement identified. No dural thickening. Vascular: Major intracranial vascular flow voids are preserved. The major dural venous sinuses appear to be normally enhancing and patent, see MRA and MRV findings below. Skull and upper cervical spine: Negative visible cervical spine. Visualized bone marrow signal is within normal limits. Sinuses/Orbits: Negative orbits.  Paranasal sinuses are clear. Other: Mastoids are clear. Visible internal auditory structures appear normal. Scalp and face soft tissues appear negative. MRA HEAD FINDINGS Antegrade flow in the posterior circulation with fairly codominant distal vertebral arteries. Normal left PICA origins, the right occurs somewhat late. No distal vertebral stenosis. Patent vertebrobasilar junction and basilar artery without stenosis.  Normal SCA and PCA origins. Posterior communicating arteries are diminutive or absent. Normal bilateral PCA branches. Antegrade flow in both ICA siphons. No siphon stenosis. Normal ophthalmic artery origins. Normal carotid termini. Normal MCA and ACA origins. Normal anterior communicating artery and visible ACA branches. Both MCA M1 segments and MCA bifurcations are patent and within normal limits. Visible bilateral MCA branches are within normal limits. MRV HEAD FINDINGS Precontrast time-of-flight images reveal preserved flow signal in the superior sagittal sinus, torcula, straight sinus, vein of Galen, internal cerebral veins, basal veins of Rosenthal, bilateral transverse and sigmoid sinuses, and both IJ bulbs. Post-contrast MRV images demonstrate normal enhancement of those same venous vascular structures. There is also flow signal and enhancement within the major cortical draining veins. IMPRESSION: 1. Normal MRI appearance of the brain, no acute intracranial abnormality. 2. Normal intracranial MRA. 3. Normal intracranial MRV. Electronically Signed   By: Odessa FlemingH  Hall M.D.   On: 10/21/2019 18:08   MR MRV HEAD W WO CONTRAST  Result Date: 10/21/2019 CLINICAL DATA:  24 year old female with persistent left side headache. History of IV drug use and partially treated endocarditis. EXAM: MRI HEAD WITHOUT AND WITH CONTRAST MRA HEAD WITHOUT CONTRAST MRV HEAD WITHOUT AND WITH CONTRAST TECHNIQUE: Multiplanar, multiecho pulse sequences of the brain and surrounding structures were obtained without and with intravenous contrast. Angiographic images of the Circle of Willis were obtained using MRA technique without intravenous contrast. Venographic images of the head were obtained using MRV technique without and with intravenous contrast. CONTRAST:  7mL GADAVIST GADOBUTROL 1 MMOL/ML IV SOLN COMPARISON:  None. FINDINGS: MRI HEAD FINDINGS Brain: Normal cerebral volume. No restricted diffusion to suggest acute infarction. No  midline shift, mass effect, evidence of mass lesion, ventriculomegaly, extra-axial collection or acute intracranial hemorrhage. Cervicomedullary junction and pituitary are within normal limits. Wallace CullensGray and white matter signal is within normal limits throughout the brain. No definite encephalomalacia. No chronic cerebral blood products. No abnormal enhancement identified. No dural thickening. Vascular: Major intracranial vascular flow voids are preserved. The major dural venous sinuses appear to be normally enhancing and patent, see MRA and MRV findings below. Skull and upper cervical spine: Negative visible cervical spine. Visualized bone marrow signal is within normal limits. Sinuses/Orbits: Negative orbits.  Paranasal sinuses are clear. Other: Mastoids are clear. Visible internal auditory structures appear normal. Scalp and face soft tissues appear negative. MRA HEAD FINDINGS Antegrade flow in the posterior circulation with fairly codominant distal vertebral arteries. Normal left PICA origins, the right occurs somewhat late. No distal vertebral stenosis. Patent vertebrobasilar junction and basilar artery without stenosis. Normal SCA and PCA origins. Posterior communicating arteries are diminutive or absent. Normal bilateral PCA branches. Antegrade flow in both ICA siphons. No siphon stenosis. Normal ophthalmic artery origins. Normal carotid  termini. Normal MCA and ACA origins. Normal anterior communicating artery and visible ACA branches. Both MCA M1 segments and MCA bifurcations are patent and within normal limits. Visible bilateral MCA branches are within normal limits. MRV HEAD FINDINGS Precontrast time-of-flight images reveal preserved flow signal in the superior sagittal sinus, torcula, straight sinus, vein of Galen, internal cerebral veins, basal veins of Rosenthal, bilateral transverse and sigmoid sinuses, and both IJ bulbs. Post-contrast MRV images demonstrate normal enhancement of those same venous vascular  structures. There is also flow signal and enhancement within the major cortical draining veins. IMPRESSION: 1. Normal MRI appearance of the brain, no acute intracranial abnormality. 2. Normal intracranial MRA. 3. Normal intracranial MRV. Electronically Signed   By: Odessa Fleming M.D.   On: 10/21/2019 18:08    Procedures Procedures (including critical care time) CRITICAL CARE Performed by: Karrie Meres   Total critical care time: 50 minutes  Critical care time was exclusive of separately billable procedures and treating other patients.  Critical care was necessary to treat or prevent imminent or life-threatening deterioration.  Critical care was time spent personally by me on the following activities: development of treatment plan with patient and/or surrogate as well as nursing, discussions with consultants, evaluation of patient's response to treatment, examination of patient, obtaining history from patient or surrogate, ordering and performing treatments and interventions, ordering and review of laboratory studies, ordering and review of radiographic studies, pulse oximetry and re-evaluation of patient's condition.   Medications Ordered in ED Medications  acetylcysteine (MUCOMYST) 20 % nebulizer / oral solution 10,160 mg (has no administration in time range)  acetaminophen (TYLENOL) tablet 650 mg (650 mg Oral Given 10/21/19 1601)  lactated ringers bolus 1,000 mL (0 mLs Intravenous Stopped 10/21/19 1835)  penicillin v potassium (VEETID) tablet 500 mg (500 mg Oral Given 10/21/19 1815)  gadobutrol (GADAVIST) 1 MMOL/ML injection 7 mL (7 mLs Intravenous Contrast Given 10/21/19 1700)  metoCLOPramide (REGLAN) tablet 10 mg (10 mg Oral Given 10/21/19 1815)  diphenhydrAMINE (BENADRYL) capsule 25 mg (25 mg Oral Given 10/21/19 1815)  lactated ringers bolus 1,000 mL (1,000 mLs Intravenous New Bag/Given 10/21/19 1839)    ED Course  I have reviewed the triage vital signs and the nursing notes.  Pertinent  labs & imaging results that were available during my care of the patient were reviewed by me and considered in my medical decision making (see chart for details).    MDM Rules/Calculators/A&P                      24 year old female presenting with headache and dental pain.   Dental pain: Present for 1 week. Has fracture and dental carie on exam without evidence of deep space infection. Has Rx for pen VK from prior ED visit yesterday.   Headache: Also present for about 1 week, mostly left sided but intermittently also has right sided headache. Neuro exam is grossly wnl, with exception of abnormal sensation to the left side of the face and LUE. Has h/o endocarditis that was not fully tx'ed within that last several months. I did not appreciate a murmur on exam but with this hx, there is concern for possible septic cerbebral emboli.   Discussed case with Dr. Freida Busman, supervising physician, who recommends consulting neurology for further recommendations regarding work-up for possible septic cerebral emboli.  3:00 PM CONSULT with Dr. Amada Jupiter with neurology who recommends obtaining MRI with and without contrast, MRV with and without contrast and MRI without contrast.  He  states that if imaging is negative, patient likely has uncomplicated headache.  Pharmacy tech informed supervising physician that pt has been taking excessive amounts of ibuprofen. Discussed with patient. She states she has been taking 10-15 tablets of ibuprofen every 4 hours. Last dose at 2-3 AM when she took 8 tablets of ibuprofen. She had also been taking 4 BC powders every 2 hours. She states she stopped doing that yesterday because she started vomiting.   3:15 PM Discussed case with Shanda Bumps from poison control. She recommends several labs which have been ordered. Recommended LR fluid bolus. Recommends to re-contact poison control when we have results of the salicylate level.   CBC nonacute CMP with low CO2 at 19, normal  electrolytes, elevated AST/ALT with normal bilirubin, normal creatinine  - has had elevations in liver enzymes in the last Mag wnl Beta hcg neg Salicylate level neg Tylenol neg Lactic neg  MRI, MRA and MRV are all negative for any acute abnormalities. No evidence of septic emboli.   6:44 PM Discussed case with poison control. Shanda Bumps spoke with toxicologist who recommended repeat salicylate, cmp, tylenol, vbg, and to give bolus dose of NAC while awaiting tylenol level. If given orally, to be dosed at 140 mg/kg.  At shift change, care transitioned to [facility] with plan to f/u on pending labs. Recommend reconsulting poison control after labs.   Final Clinical Impression(s) / ED Diagnoses Final diagnoses:  Pain due to dental caries  Elevated liver enzymes  Poisoning by other nonsteroidal anti-inflammatory drugs (nsaid), accidental (unintentional), initial encounter    Rx / DC Orders ED Discharge Orders    None       Rayne Du 10/21/19 1944    Eber Hong, MD 10/23/19 1317

## 2019-10-21 NOTE — Discharge Instructions (Addendum)
We checked a hepatitis panel today but you will need to follow-up with a gastroenterologist for the lab results and to follow-up about your elevated liver enzymes.  You need to fill the prescription that you were given for the penicillin at your prior ER visit yesterday.  Please return to the emergency department for any new or worsening symptoms.

## 2019-10-21 NOTE — ED Notes (Signed)
Patient verbalizes understanding of discharge instructions. Opportunity for questioning and answers were provided. Armband removed by staff, pt discharged from ED ambulatory.   

## 2020-03-10 ENCOUNTER — Inpatient Hospital Stay (HOSPITAL_COMMUNITY): Payer: Medicaid Other

## 2020-03-10 ENCOUNTER — Encounter (HOSPITAL_COMMUNITY): Payer: Self-pay

## 2020-03-10 ENCOUNTER — Inpatient Hospital Stay (HOSPITAL_COMMUNITY)
Admission: AD | Admit: 2020-03-10 | Discharge: 2020-03-10 | Disposition: A | Discharge status: Other Acute Inpatient Hospital | Payer: Medicaid Other | Attending: Obstetrics and Gynecology | Admitting: Obstetrics and Gynecology

## 2020-03-10 ENCOUNTER — Other Ambulatory Visit: Payer: Self-pay

## 2020-03-10 DIAGNOSIS — R748 Abnormal levels of other serum enzymes: Secondary | ICD-10-CM

## 2020-03-10 DIAGNOSIS — O99331 Smoking (tobacco) complicating pregnancy, first trimester: Secondary | ICD-10-CM | POA: Diagnosis not present

## 2020-03-10 DIAGNOSIS — F17200 Nicotine dependence, unspecified, uncomplicated: Secondary | ICD-10-CM | POA: Diagnosis not present

## 2020-03-10 DIAGNOSIS — O26899 Other specified pregnancy related conditions, unspecified trimester: Secondary | ICD-10-CM

## 2020-03-10 DIAGNOSIS — O210 Mild hyperemesis gravidarum: Secondary | ICD-10-CM

## 2020-03-10 DIAGNOSIS — R109 Unspecified abdominal pain: Secondary | ICD-10-CM

## 2020-03-10 DIAGNOSIS — Z3A08 8 weeks gestation of pregnancy: Secondary | ICD-10-CM | POA: Diagnosis not present

## 2020-03-10 DIAGNOSIS — O26891 Other specified pregnancy related conditions, first trimester: Secondary | ICD-10-CM | POA: Diagnosis present

## 2020-03-10 DIAGNOSIS — Z3491 Encounter for supervision of normal pregnancy, unspecified, first trimester: Secondary | ICD-10-CM

## 2020-03-10 DIAGNOSIS — R102 Pelvic and perineal pain: Secondary | ICD-10-CM | POA: Diagnosis not present

## 2020-03-10 DIAGNOSIS — Z3A09 9 weeks gestation of pregnancy: Secondary | ICD-10-CM

## 2020-03-10 DIAGNOSIS — Z8619 Personal history of other infectious and parasitic diseases: Secondary | ICD-10-CM

## 2020-03-10 DIAGNOSIS — R110 Nausea: Secondary | ICD-10-CM

## 2020-03-10 HISTORY — DX: Unspecified asthma, uncomplicated: J45.909

## 2020-03-10 HISTORY — DX: Other psychoactive substance abuse, uncomplicated: F19.10

## 2020-03-10 LAB — URINALYSIS, ROUTINE W REFLEX MICROSCOPIC
Bilirubin Urine: NEGATIVE
Glucose, UA: NEGATIVE mg/dL
Hgb urine dipstick: NEGATIVE
Ketones, ur: NEGATIVE mg/dL
Leukocytes,Ua: NEGATIVE
Nitrite: NEGATIVE
Protein, ur: NEGATIVE mg/dL
Specific Gravity, Urine: 1.008 (ref 1.005–1.030)
pH: 7 (ref 5.0–8.0)

## 2020-03-10 LAB — COMPREHENSIVE METABOLIC PANEL
ALT: 83 U/L — ABNORMAL HIGH (ref 0–44)
AST: 43 U/L — ABNORMAL HIGH (ref 15–41)
Albumin: 3.5 g/dL (ref 3.5–5.0)
Alkaline Phosphatase: 36 U/L — ABNORMAL LOW (ref 38–126)
Anion gap: 10 (ref 5–15)
BUN: 8 mg/dL (ref 6–20)
CO2: 21 mmol/L — ABNORMAL LOW (ref 22–32)
Calcium: 8.7 mg/dL — ABNORMAL LOW (ref 8.9–10.3)
Chloride: 105 mmol/L (ref 98–111)
Creatinine, Ser: 0.67 mg/dL (ref 0.44–1.00)
GFR calc Af Amer: >60 mL/min (ref >60)
GFR calc non Af Amer: >60 mL/min (ref >60)
Glucose, Bld: 105 mg/dL — ABNORMAL HIGH (ref 70–99)
Potassium: 3.3 mmol/L — ABNORMAL LOW (ref 3.5–5.1)
Sodium: 136 mmol/L (ref 135–145)
Total Bilirubin: 1.4 mg/dL — ABNORMAL HIGH (ref 0.3–1.2)
Total Protein: 7 g/dL (ref 6.5–8.1)

## 2020-03-10 LAB — WET PREP, GENITAL
Clue Cells Wet Prep HPF POC: NONE SEEN
Sperm: NONE SEEN
Trich, Wet Prep: NONE SEEN
Yeast Wet Prep HPF POC: NONE SEEN

## 2020-03-10 LAB — HCG, QUANTITATIVE, PREGNANCY: hCG, Beta Chain, Quant, S: 137054 m[IU]/mL — ABNORMAL HIGH (ref <5)

## 2020-03-10 LAB — CBC
HCT: 41.2 % (ref 36.0–46.0)
Hemoglobin: 14.1 g/dL (ref 12.0–15.0)
MCH: 31.3 pg (ref 26.0–34.0)
MCHC: 34.2 g/dL (ref 30.0–36.0)
MCV: 91.4 fL (ref 80.0–100.0)
Platelets: 179 10*3/uL (ref 150–400)
RBC: 4.51 MIL/uL (ref 3.87–5.11)
RDW: 12.2 % (ref 11.5–15.5)
WBC: 8.1 10*3/uL (ref 4.0–10.5)
nRBC: 0 % (ref 0.0–0.2)

## 2020-03-10 LAB — PREGNANCY, URINE: Preg Test, Ur: POSITIVE — AB

## 2020-03-10 LAB — RAPID URINE DRUG SCREEN, HOSP PERFORMED
Amphetamines: NOT DETECTED
Barbiturates: NOT DETECTED
Benzodiazepines: NOT DETECTED
Cocaine: NOT DETECTED
Opiates: NOT DETECTED
Tetrahydrocannabinol: NOT DETECTED

## 2020-03-10 LAB — I-STAT BETA HCG BLOOD, ED (MC, WL, AP ONLY): I-stat hCG, quantitative: >2000 m[IU]/mL — ABNORMAL HIGH (ref <5)

## 2020-03-10 LAB — LIPASE, BLOOD: Lipase: 26 U/L (ref 11–51)

## 2020-03-10 IMAGING — US US OB COMP LESS 14 WK
1 series · 15 of 26 positions shown · non-contrast
Comparison: None.

CLINICAL DATA: Intermittent lower abdominal cramping. Estimated
gestational age of 9 weeks, 2 days by LMP.

EXAM:
OBSTETRIC <14 WK ULTRASOUND
TECHNIQUE: Transabdominal ultrasound was performed for evaluation of the
gestation as well as the maternal uterus and adnexal regions.

[Series 1: us ob comp less 14 wk · 26 acquisitions, 15 frames shown]
[im 1/26]
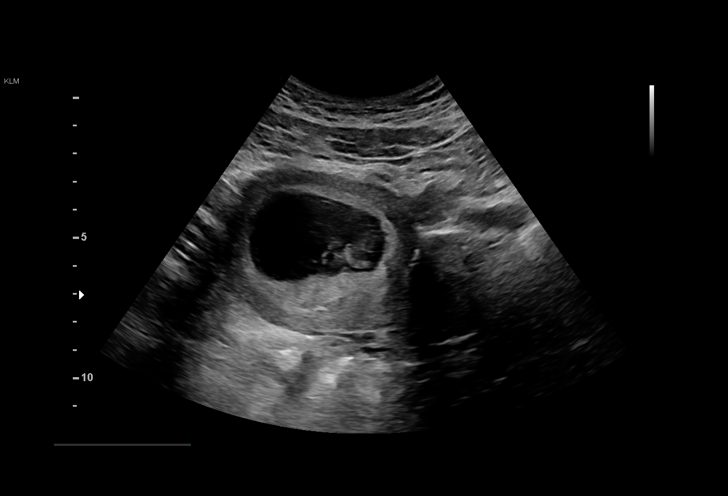
[im 3/26]
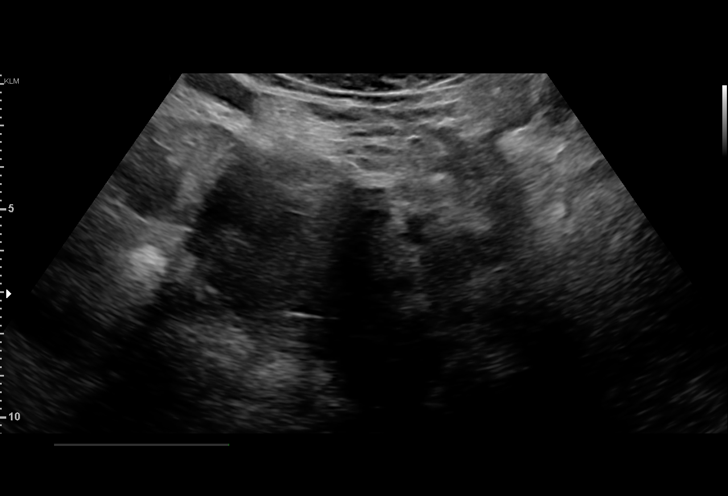
[im 5/26]
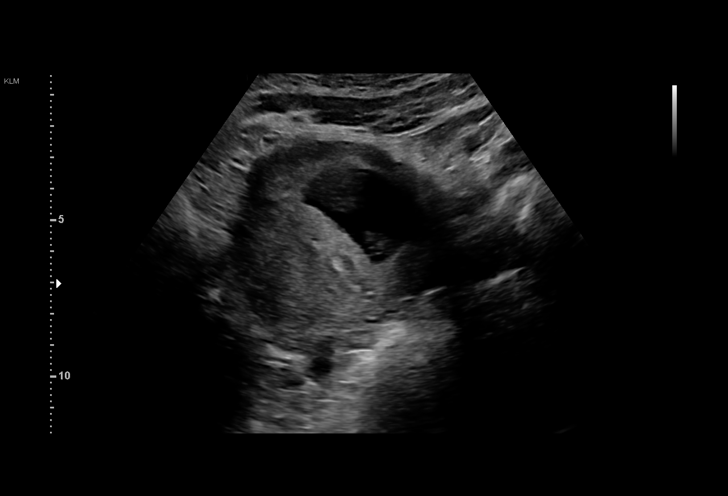
[im 7/26]
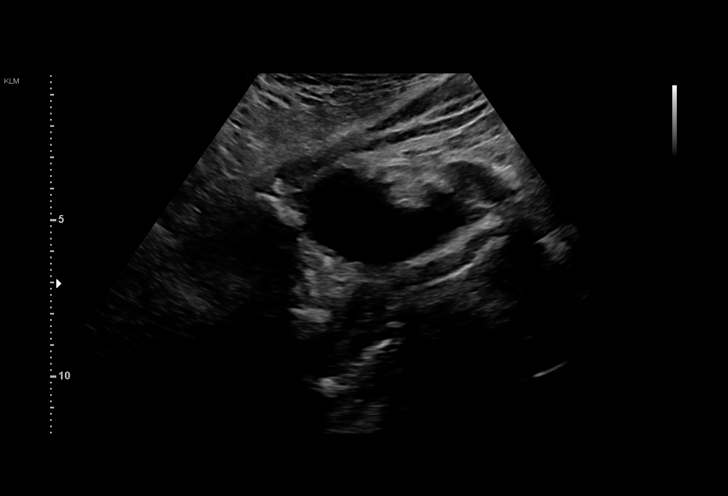
[im 8/26]
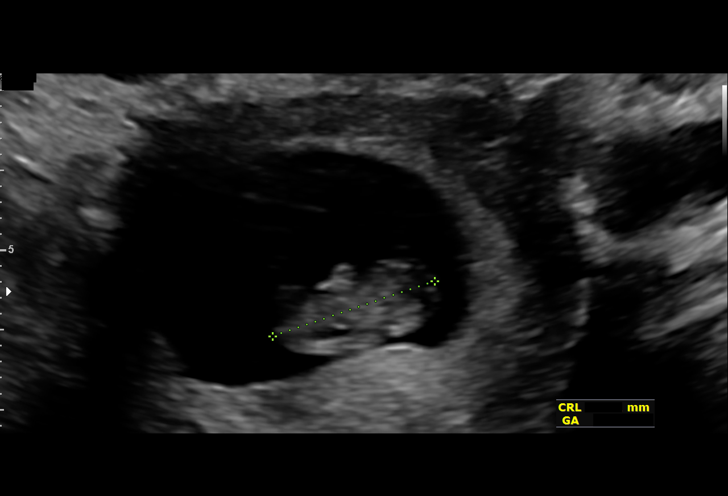
[im 10/26]
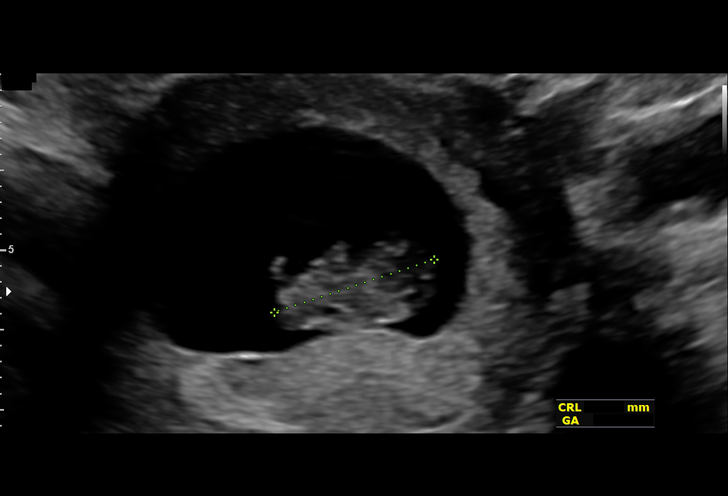
[im 12/26]
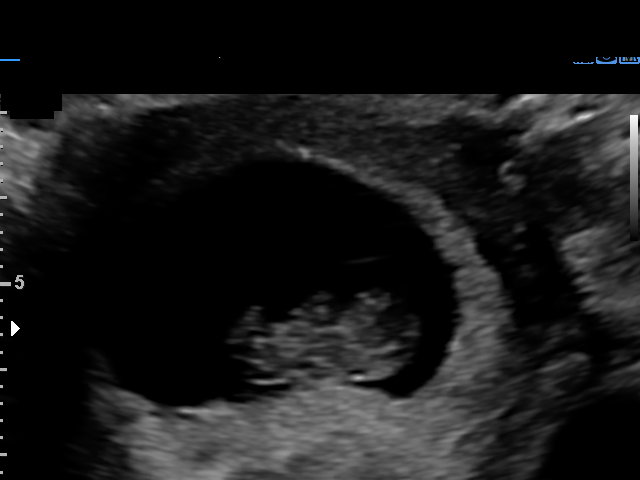
[im 14/26]
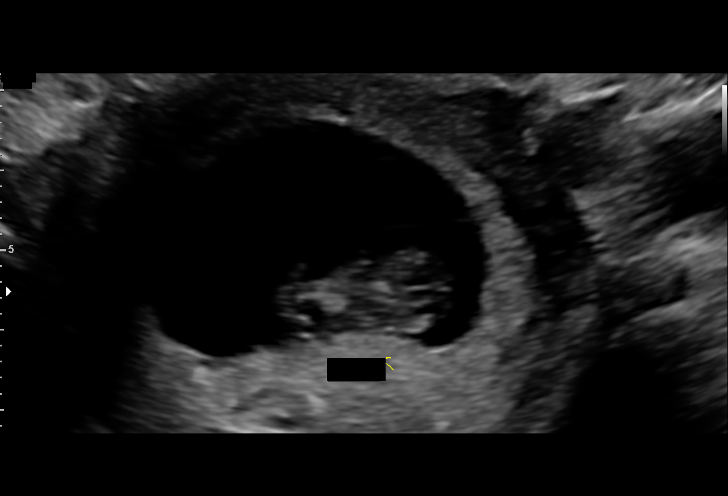
[im 15/26]
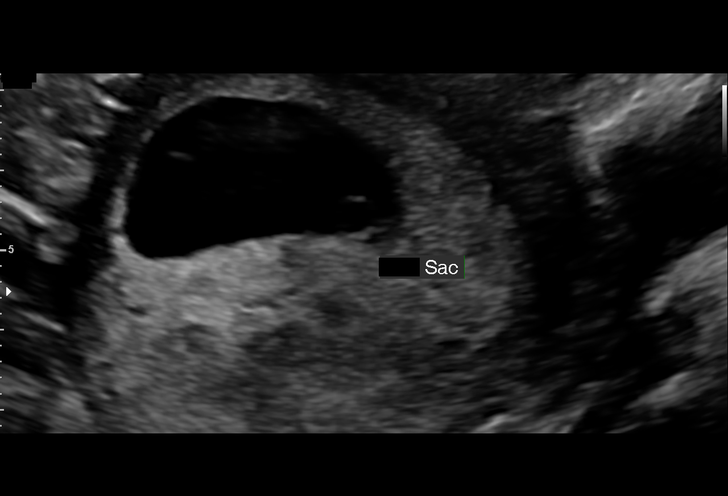
[im 17/26]
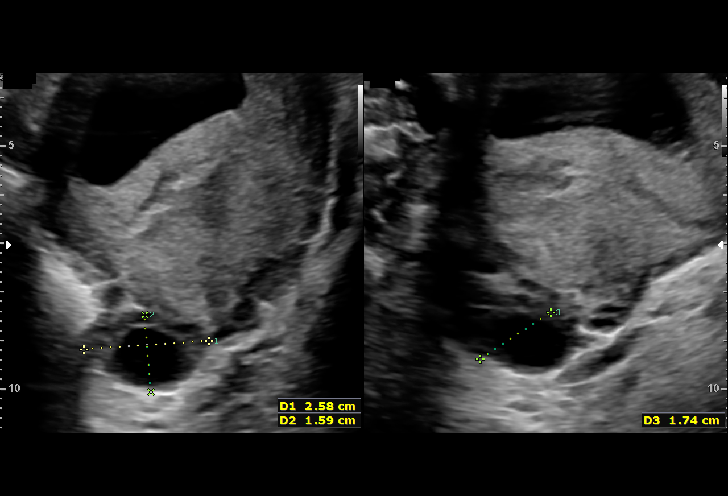
[im 19/26]
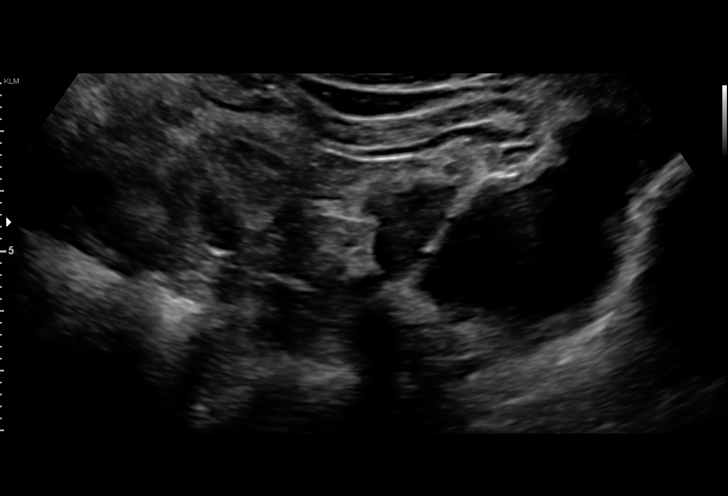
[im 20/26]
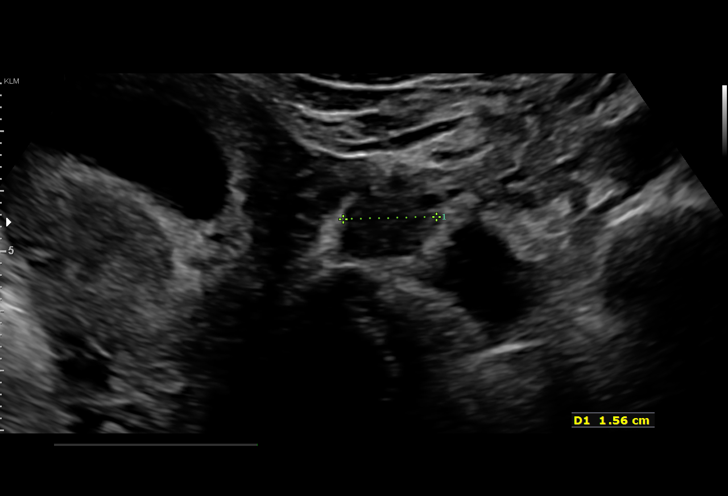
[im 22/26]
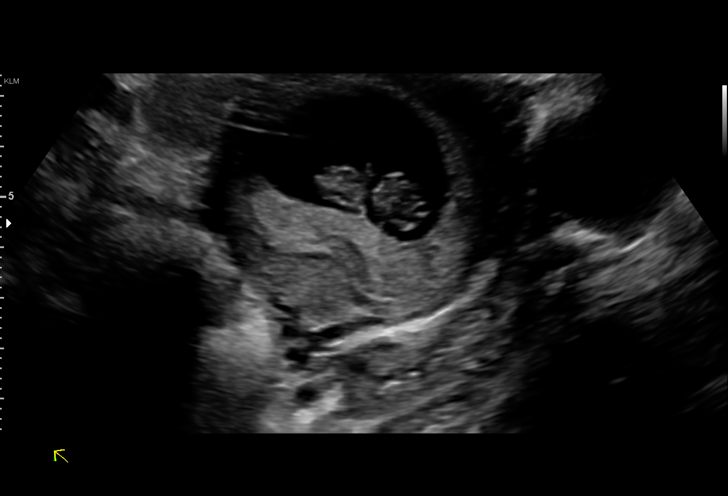
[im 24/26]
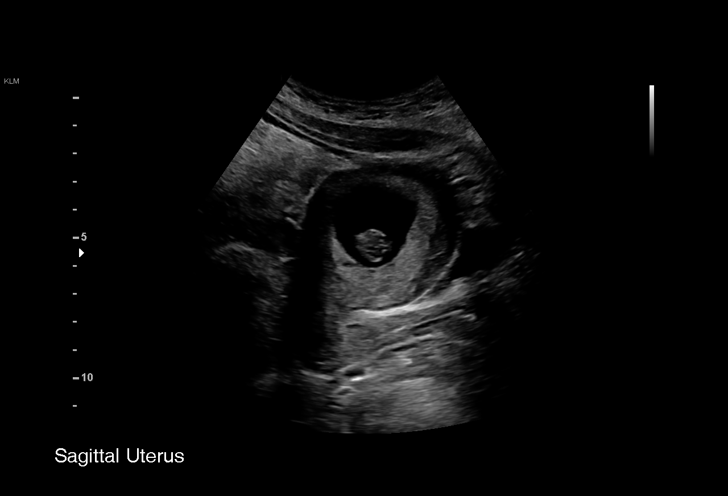
[im 26/26]
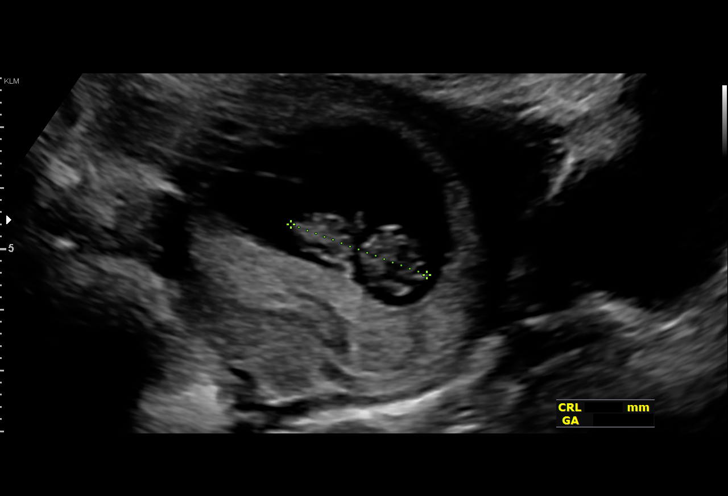

[15 of 26 positions shown; findings below may reference images not displayed]

FINDINGS: Intrauterine gestational sac: Single.

Yolk sac:  Visualized.

Embryo:  Visualized.

Cardiac Activity: Visualized.

Heart Rate: 171 bpm

CRL:   22.4 mm   8 w 6 d                  US EDC: [DATE]

Subchorionic hemorrhage:  None visualized.

Maternal uterus/adnexae: Unremarkable.
IMPRESSION: 1. Single live intrauterine pregnancy with estimated gestational age
of 8 weeks, 6 days. No acute abnormality.

## 2020-03-10 MED ORDER — PROMETHAZINE HCL 25 MG PO TABS: 12.5000 mg | ORAL_TABLET | Freq: Four times a day (QID) | ORAL | 0 refills | Status: AC | PRN

## 2020-03-10 MED ORDER — SODIUM CHLORIDE 0.9% FLUSH: 3.0000 mL | Freq: Once | INTRAVENOUS | Status: DC

## 2020-03-10 MED ORDER — SCOPOLAMINE 1 MG/3DAYS TD PT72: 1.0000 | MEDICATED_PATCH | TRANSDERMAL | 12 refills | Status: AC

## 2020-03-10 MED ORDER — SCOPOLAMINE 1 MG/3DAYS TD PT72
1.0000 | MEDICATED_PATCH | TRANSDERMAL | Status: DC
  Administered 2020-03-10: 1.5 mg via TRANSDERMAL
  Filled 2020-03-10: qty 1

## 2020-03-10 NOTE — Discharge Instructions (Signed)
Morning Sickness  Morning sickness is when you feel sick to your stomach (nauseous) during pregnancy. You may feel sick to your stomach and throw up (vomit). You may feel sick in the morning, but you can feel this way at any time of day. Some women feel very sick to their stomach and cannot stop throwing up (hyperemesis gravidarum). Follow these instructions at home: Medicines  Take over-the-counter and prescription medicines only as told by your doctor. Do not take any medicines until you talk with your doctor about them first.  Taking multivitamins before getting pregnant can stop or lessen the harshness of morning sickness. Eating and drinking  Eat dry toast or crackers before getting out of bed.  Eat 5 or 6 small meals a day.  Eat dry and bland foods like rice and baked potatoes.  Do not eat greasy, fatty, or spicy foods.  Have someone cook for you if the smell of food causes you to feel sick or throw up.  If you feel sick to your stomach after taking prenatal vitamins, take them at night or with a snack.  Eat protein when you need a snack. Nuts, yogurt, and cheese are good choices.  Drink fluids throughout the day.  Try ginger ale made with real ginger, ginger tea made from fresh grated ginger, or ginger candies. General instructions  Do not use any products that have nicotine or tobacco in them, such as cigarettes and e-cigarettes. If you need help quitting, ask your doctor.  Use an air purifier to keep the air in your house free of smells.  Get lots of fresh air.  Try to avoid smells that make you feel sick.  Try: ? Wearing a bracelet that is used for seasickness (acupressure wristband). ? Going to a doctor who puts thin needles into certain body points (acupuncture) to improve how you feel. Contact a doctor if:  You need medicine to feel better.  You feel dizzy or light-headed.  You are losing weight. Get help right away if:  You feel very sick to your  stomach and cannot stop throwing up.  You pass out (faint).  You have very bad pain in your belly. Summary  Morning sickness is when you feel sick to your stomach (nauseous) during pregnancy.  You may feel sick in the morning, but you can feel this way at any time of day.  Making some changes to what you eat may help your symptoms go away. This information is not intended to replace advice given to you by your health care provider. Make sure you discuss any questions you have with your health care provider. Document Revised: 08/02/2017 Document Reviewed: 09/20/2016 Elsevier Patient Education  2020 Elsevier Inc.   Abdominal Pain During Pregnancy  Belly (abdominal) pain is common during pregnancy. There are many possible causes. Most of the time, it is not a serious problem. Other times, it can be a sign that something is wrong with the pregnancy. Always tell your doctor if you have belly pain. Follow these instructions at home:  Do not have sex or put anything in your vagina until your pain goes away completely.  Get plenty of rest until your pain gets better.  Drink enough fluid to keep your pee (urine) pale yellow.  Take over-the-counter and prescription medicines only as told by your doctor.  Keep all follow-up visits as told by your doctor. This is important. Contact a doctor if:  Your pain continues or gets worse after resting.  You have  lower belly pain that: ? Comes and goes at regular times. ? Spreads to your back. ? Feels like menstrual cramps.  You have pain or burning when you pee (urinate). Get help right away if:  You have a fever or chills.  You have vaginal bleeding.  You are leaking fluid from your vagina.  You are passing tissue from your vagina.  You throw up (vomit) for more than 24 hours.  You have watery poop (diarrhea) for more than 24 hours.  Your baby is moving less than usual.  You feel very weak or faint.  You have shortness of  breath.  You have very bad pain in your upper belly. Summary  Belly (abdominal) pain is common during pregnancy. There are many possible causes.  If you have belly pain during pregnancy, tell your doctor right away.  Keep all follow-up visits as told by your doctor. This is important. This information is not intended to replace advice given to you by your health care provider. Make sure you discuss any questions you have with your health care provider. Document Revised: 12/08/2018 Document Reviewed: 11/22/2016 Elsevier Patient Education  2020 Elsevier Inc.  [place] [facility] Providers    [facility] [employer] [facility]  Phone: [phone]  [facility] [employer] [facility] [place]   Phone: [phone]  [facility] [employer] [facility] [place]  Phone: [phone]  [facility] [employer] [facility]  Phone: [phone]  [facility] [employer] [facility] [place]  Phone: [phone]  [facility]   Phone: [phone]  [facility]       Phone: [phone]  [facility]    Phone: [phone]   [facility]    Phone: [phone]  [facility]    Phone: [phone]  [facility]    Phone: [phone]  [facility]       Phone: [phone]   [facility]  Phone: [phone]  [facility]    Phone: [phone]  [facility] [place]   Phone: [phone]  [facility]      Phone: [phone]  [facility]    Phone: [phone]

## 2020-03-10 NOTE — ED Notes (Signed)
Report called to MAU  

## 2020-03-10 NOTE — MAU Provider Note (Signed)
History     CSN: [id number]  Arrival date and time: 03/10/20 1028   First Provider Initiated Contact with Patient 03/10/20 1600      Chief Complaint  Patient presents with  . Pelvic Pain  . Abdominal Pain  . Nausea   24 y.o. G1 @[redacted]w[redacted]d  by LMP presenting with nausea and low abdominal cramping. Sx have been ongoing for weeks. No vomiting. No fevers. LAP is intermittent and bilateral. Rates pain 5/10. Has not tried anything for it. Denies urinary sx. No VB or discharge. Hx of heroin use and hepatitis C. Last 7/10 was 11 mos ago.    OB History    Gravida  1   Para      Term      Preterm      AB      Living        SAB      TAB      Ectopic      Multiple      Live Births              Past Medical History:  Diagnosis Date  . Asthma   . Drug addict (HCC)   . Endocarditis 2020  . Hepatitis C   . IV drug abuse (HCC)   . MRSA bacteremia 2020  . Septic embolism (HCC) 2020    History reviewed. No pertinent surgical history.  History reviewed. No pertinent family history.  Social History   Tobacco Use  . Smoking status: Current Every Day Smoker  . Smokeless tobacco: Never Used  Substance Use Topics  . Alcohol use: No  . Drug use: Not Currently    Comment: prescription pain killers n past;  pt states she does not use them any more    Allergies:  Allergies  Allergen Reactions  . Prednisone Swelling    No medications prior to admission.    Review of Systems  Gastrointestinal: Positive for abdominal pain and nausea. Negative for constipation, diarrhea and vomiting.  Genitourinary: Negative for dysuria, hematuria, urgency, vaginal bleeding and vaginal discharge.   Physical Exam   Blood pressure 115/75, pulse 66, temperature 98.6 F (37 C), temperature source Oral, resp. rate 16, height 5\' 5"  (1.651 m), weight 87.7 kg, last menstrual period 01/05/2020, SpO2 100 %.  Physical Exam Vitals and nursing note reviewed. Exam conducted with a chaperone  present.  Constitutional:      General: She is not in acute distress.    Appearance: She is well-developed.  HENT:     Head: Normocephalic.  Cardiovascular:     Rate and Rhythm: Normal rate.  Pulmonary:     Effort: Pulmonary effort is normal. No respiratory distress.  Abdominal:     General: There is no distension.     Palpations: Abdomen is soft.     Tenderness: There is no abdominal tenderness. There is no guarding or rebound.  Skin:    General: Skin is warm and dry.  Neurological:     General: No focal deficit present.     Mental Status: She is alert and oriented to person, place, and time.  Psychiatric:        Mood and Affect: Mood normal.    Results for orders placed or performed during the hospital encounter of 03/10/20 (from the past 24 hour(s))  Urinalysis, Routine w reflex microscopic     Status: None   Collection Time: 03/10/20 11:00 AM  Result Value Ref Range   Color, Urine YELLOW YELLOW  APPearance CLEAR CLEAR   Specific Gravity, Urine 1.008 1.005 - 1.030   pH 7.0 5.0 - 8.0   Glucose, UA NEGATIVE NEGATIVE mg/dL   Hgb urine dipstick NEGATIVE NEGATIVE   Bilirubin Urine NEGATIVE NEGATIVE   Ketones, ur NEGATIVE NEGATIVE mg/dL   Protein, ur NEGATIVE NEGATIVE mg/dL   Nitrite NEGATIVE NEGATIVE   Leukocytes,Ua NEGATIVE NEGATIVE  Lipase, blood     Status: None   Collection Time: 03/10/20 11:04 AM  Result Value Ref Range   Lipase 26 11 - 51 U/L  Comprehensive metabolic panel     Status: Abnormal   Collection Time: 03/10/20 11:04 AM  Result Value Ref Range   Sodium 136 135 - 145 mmol/L   Potassium 3.3 (L) 3.5 - 5.1 mmol/L   Chloride 105 98 - 111 mmol/L   CO2 21 (L) 22 - 32 mmol/L   Glucose, Bld 105 (H) 70 - 99 mg/dL   BUN 8 6 - 20 mg/dL   Creatinine, Ser 7.32 0.44 - 1.00 mg/dL   Calcium 8.7 (L) 8.9 - 10.3 mg/dL   Total Protein 7.0 6.5 - 8.1 g/dL   Albumin 3.5 3.5 - 5.0 g/dL   AST 43 (H) 15 - 41 U/L   ALT 83 (H) 0 - 44 U/L   Alkaline Phosphatase 36 (L) 38 -  126 U/L   Total Bilirubin 1.4 (H) 0.3 - 1.2 mg/dL   GFR calc non Af Amer >60 >60 mL/min   GFR calc Af Amer >60 >60 mL/min   Anion gap 10 5 - 15  CBC     Status: None   Collection Time: 03/10/20 11:04 AM  Result Value Ref Range   WBC 8.1 4.0 - 10.5 K/uL   RBC 4.51 3.87 - 5.11 MIL/uL   Hemoglobin 14.1 12.0 - 15.0 g/dL   HCT 20.2 36 - 46 %   MCV 91.4 80.0 - 100.0 fL   MCH 31.3 26.0 - 34.0 pg   MCHC 34.2 30.0 - 36.0 g/dL   RDW 54.2 70.6 - 23.7 %   Platelets 179 150 - 400 K/uL   nRBC 0.0 0.0 - 0.2 %  I-Stat beta hCG blood, ED     Status: Abnormal   Collection Time: 03/10/20  2:18 PM  Result Value Ref Range   I-stat hCG, quantitative >2,000.0 (H) <5 mIU/mL   Comment 3          Pregnancy, urine     Status: Abnormal   Collection Time: 03/10/20  2:19 PM  Result Value Ref Range   Preg Test, Ur POSITIVE (A) NEGATIVE  hCG, quantitative, pregnancy     Status: Abnormal   Collection Time: 03/10/20  2:43 PM  Result Value Ref Range   hCG, Beta Chain, Quant, S 137,054 (H) <5 mIU/mL  Wet prep, genital     Status: Abnormal   Collection Time: 03/10/20  4:17 PM  Result Value Ref Range   Yeast Wet Prep HPF POC NONE SEEN NONE SEEN   Trich, Wet Prep NONE SEEN NONE SEEN   Clue Cells Wet Prep HPF POC NONE SEEN NONE SEEN   WBC, Wet Prep HPF POC FEW (A) NONE SEEN   Sperm NONE SEEN   Urine rapid drug screen (hosp performed)     Status: None   Collection Time: 03/10/20  4:38 PM  Result Value Ref Range   Opiates NONE DETECTED NONE DETECTED   Cocaine NONE DETECTED NONE DETECTED   Benzodiazepines NONE DETECTED NONE DETECTED   Amphetamines NONE  DETECTED NONE DETECTED   Tetrahydrocannabinol NONE DETECTED NONE DETECTED   Barbiturates NONE DETECTED NONE DETECTED   US OB Comp Less 14 Wks  Result Date: 03/10/2020 CLINICAL DATA:  Intermittent lower abdominal cramping. Estimated gestational age of [redacted] weeks, 2 days by LMP. EXAM: OBSTETRIC <14 WK ULTRASOUND TECHNIQUE: Transabdominal ultrasound was performed for  evaluation of the gestation as well as the maternal uterus and adnexal regions. COMPARISON:  None. FINDINGS: Intrauterine gestational sac: Single. Yolk sac:  Visualized. Embryo:  Visualized. Cardiac Activity: Visualized. Heart Rate: 171 bpm CRL:   22.4 mm   8 w 6 d                  [place] EDC: 10/14/2020 Subchorionic hemorrhage:  None visualized. Maternal uterus/adnexae: Unremarkable. IMPRESSION: 1. Single live intrauterine pregnancy with estimated gestational age of [redacted] weeks, 6 days. No acute abnormality. Electronically Signed   By: Obie Dredge M.D.   On: 03/10/2020 17:18   MAU Course  Procedures Scopolamine patch  MDM Labs and [place] ordered and reviewed. Viable IUP on [place], consistent with LMP dating. No emesis, feels hungry. Liver enzymes elevated, hx Hep C, will follow up outpt. Stable for discharge home.   Assessment and Plan   1. Pelvic pain in pregnancy   2. Abdominal cramping affecting pregnancy   3. Normal intrauterine pregnancy on prenatal ultrasound in first trimester   4. [redacted] weeks gestation of pregnancy   5. Morning sickness    Discharge home Follow up with OB provider of choice to start care- list provided Pregnancy verification letter provided Rx Scopolamine Rx Phenergan Return precautions  Allergies as of 03/10/2020      Reactions   Prednisone Swelling      Medication List    TAKE these medications   promethazine 25 MG tablet Commonly known as: PHENERGAN Take 0.5-1 tablets (12.5-25 mg total) by mouth every 6 (six) hours as needed for nausea or vomiting.   scopolamine 1 MG/3DAYS Commonly known as: TRANSDERM-SCOP Place 1 patch (1.5 mg total) onto the skin every 3 (three) days. Start taking on: March 13, 2020       Donette Larry, [place] 03/10/2020, 5:37 PM

## 2020-03-10 NOTE — ED Provider Notes (Signed)
[place] EMERGENCY DEPARTMENT Provider Note  CSN: [id number] Arrival date & time: 03/10/20 1028    History Chief Complaint  Patient presents with  . Pelvic Pain    HPI  Tracy Fowler is a 24 y.o. female presents with several days of moderate cramping bilateral pelvic pain, associated with nausea but no vomiting. She is unsure if she is pregnant, has not taken a home test although she is trying to get pregnant and had her Nexplanon removed in May. Has not had a menses since then, LMP was 5/4. Never been pregnant before.    Past Medical History:  Diagnosis Date  . Drug addict (HCC)   . Hepatitis C     History reviewed. No pertinent surgical history.  No family history on file.  Social History   Tobacco Use  . Smoking status: Current Every Day Smoker  . Smokeless tobacco: Never Used  Substance Use Topics  . Alcohol use: No  . Drug use: Yes    Comment: prescription pain killers n past;  pt states she does not use them any more     Home Medications Prior to Admission medications   Medication Sig Start Date End Date Taking? Authorizing Provider  Aspirin-Salicylamide-Caffeine (BC HEADACHE POWDER PO) Take 1-4 packets by mouth See admin instructions. 3-4 times daily for pain.    [provider]  ibuprofen (ADVIL) 200 MG tablet Take 3,600 mg by mouth See admin instructions. Six times daily    [provider]     Allergies    Prednisone   Review of Systems   Review of Systems A comprehensive review of systems was completed and negative except as noted in HPI.    Physical Exam BP 139/86 (BP Location: Left Arm)   Pulse 78   Temp 98.6 F (37 C) (Oral)   Resp 14   Ht 5\' 9"  (1.753 m)   Wt 81.6 kg   LMP 01/05/2020   SpO2 100%   BMI 26.58 kg/m   Physical Exam Vitals and nursing note reviewed.  Constitutional:      Appearance: Normal appearance.  HENT:     Head: Normocephalic and atraumatic.     Nose: Nose normal.     Mouth/Throat:      Mouth: Mucous membranes are moist.  Eyes:     Extraocular Movements: Extraocular movements intact.     Conjunctiva/sclera: Conjunctivae normal.  Cardiovascular:     Rate and Rhythm: Normal rate.  Pulmonary:     Effort: Pulmonary effort is normal.     Breath sounds: Normal breath sounds.  Abdominal:     General: Abdomen is flat.     Palpations: Abdomen is soft.     Tenderness: There is no abdominal tenderness. There is no guarding.  Musculoskeletal:        General: No swelling. Normal range of motion.     Cervical back: Neck supple.  Skin:    General: Skin is warm and dry.  Neurological:     General: No focal deficit present.     Mental Status: She is alert.  Psychiatric:        Mood and Affect: Mood normal.      ED Results / Procedures / Treatments   Labs (all labs ordered are listed, but only abnormal results are displayed) Labs Reviewed  COMPREHENSIVE METABOLIC PANEL - Abnormal; Notable for the following components:      Result Value   Potassium 3.3 (*)    CO2 21 (*)  Glucose, Bld 105 (*)    Calcium 8.7 (*)    AST 43 (*)    ALT 83 (*)    Alkaline Phosphatase 36 (*)    Total Bilirubin 1.4 (*)    All other components within normal limits  PREGNANCY, URINE - Abnormal; Notable for the following components:   Preg Test, Ur POSITIVE (*)    All other components within normal limits  I-STAT BETA HCG BLOOD, ED ([facility], [facility], [facility] ONLY) - Abnormal; Notable for the following components:   I-stat hCG, quantitative >2,000.0 (*)    All other components within normal limits  LIPASE, BLOOD  CBC  URINALYSIS, ROUTINE W REFLEX MICROSCOPIC  HCG, QUANTITATIVE, PREGNANCY    EKG None  Radiology No results found.  Procedures Procedures  Medications Ordered in the ED Medications  sodium chloride flush (NS) 0.9 % injection 3 mL (3 mLs Intravenous Not Given 03/10/20 1437)     MDM Rules/Calculators/A&P MDM Patient with positive pregnancy test, otherwise stable. Abdomen is benign.  Will add HCG Quant and send to MAU for further evaluation. Spoke with MAU provider who agrees.  ED Course  I have reviewed the triage vital signs and the nursing notes.  Pertinent labs & imaging results that were available during my care of the patient were reviewed by me and considered in my medical decision making (see chart for details).     Final Clinical Impression(s) / ED Diagnoses Final diagnoses:  Pelvic pain in pregnancy    Rx / DC Orders ED Discharge Orders    None       Pollyann Savoy, MD 03/10/20 1444

## 2020-03-10 NOTE — ED Triage Notes (Signed)
Patient complains of abdominal cramping and nausea in am x 4 weeks, has not done home preg test.

## 2020-03-10 NOTE — MAU Note (Signed)
Sent up from ER.  For wks now has been nauseated.  Is hungry, just feels sick.  Has been cramping in lower abd off and on since end of May/early June- was thinking she was going to start period and it never came on. No bleeding. Did not know she was preg.

## 2020-03-10 NOTE — ED Notes (Signed)
TC to transport team

## 2020-03-10 NOTE — ED Notes (Signed)
TC to lab to have Qu Hcg added.

## 2020-03-10 NOTE — ED Notes (Signed)
2nd call to transport [phone]

## 2020-03-11 LAB — GC/CHLAMYDIA PROBE AMP (~~LOC~~) NOT AT ARMC
Chlamydia: NEGATIVE
Comment: NEGATIVE
Comment: NORMAL
Neisseria Gonorrhea: NEGATIVE
